# Patient Record
Sex: Male | Born: 2005 | Race: White | Hispanic: No | Marital: Single | Smoking: Never smoker
Health system: Southern US, Community
[De-identification: ages and names within clinical notes are randomized; demographics above are authoritative.]

## PROBLEM LIST (undated history)

## (undated) DIAGNOSIS — F209 Schizophrenia, unspecified: Secondary | ICD-10-CM

## (undated) HISTORY — PX: HERNIA REPAIR: SHX51

---
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
## 2024-01-08 ENCOUNTER — Telehealth: Payer: Self-pay | Admitting: Psychiatry

## 2024-01-08 ENCOUNTER — Other Ambulatory Visit: Payer: Self-pay

## 2024-01-08 ENCOUNTER — Telehealth: Payer: Self-pay

## 2024-01-08 ENCOUNTER — Ambulatory Visit: Payer: MEDICAID | Admitting: Psychiatry

## 2024-01-08 ENCOUNTER — Encounter: Payer: Self-pay | Admitting: Psychiatry

## 2024-01-08 ENCOUNTER — Ambulatory Visit (INDEPENDENT_AMBULATORY_CARE_PROVIDER_SITE_OTHER): Payer: MEDICAID | Admitting: Psychiatry

## 2024-01-08 VITALS — BP 131/78 | HR 74 | Temp 97.9°F | Ht 65.0 in | Wt 151.8 lb

## 2024-01-08 DIAGNOSIS — F200 Paranoid schizophrenia: Secondary | ICD-10-CM

## 2024-01-08 DIAGNOSIS — Z79899 Other long term (current) drug therapy: Secondary | ICD-10-CM | POA: Diagnosis not present

## 2024-01-08 DIAGNOSIS — F1290 Cannabis use, unspecified, uncomplicated: Secondary | ICD-10-CM

## 2024-01-08 DIAGNOSIS — G2571 Drug induced akathisia: Secondary | ICD-10-CM | POA: Diagnosis not present

## 2024-01-08 MED ORDER — CLONIDINE HCL 0.1 MG PO TABS: 0.1000 mg | ORAL_TABLET | Freq: Every day | ORAL | 0 refills | Status: DC

## 2024-01-08 NOTE — Telephone Encounter (Signed)
 pt mother called states that eural is gotten worse since he came into the office. she states that he tryinng to break his phone and just worse than before

## 2024-01-08 NOTE — Patient Instructions (Signed)
 Continue Abilify  maintenance 400 mg every 28 days   Start clonidine 0.1 mg at night  Discontinue pregabalin , trazodone  Next appointment- 10/14 at 11 AM

## 2024-01-08 NOTE — Telephone Encounter (Signed)
 I was able to speak with both him and his mother. Details in the note.

## 2024-01-16 NOTE — Progress Notes (Signed)
 [facility] MD/PA/NP OP Progress Note  01/22/2024 3:16 PM Caleb Reid  MRN:  [id number]  Chief Complaint:  Chief Complaint  Patient presents with   Follow-up   HPI:  This is a follow-up appointment for schizophrenia, akathisia. He states that his temper is better since being on Vraylar.  However, he continues to feel restless.  He would like to have some medication right away to help this.  He cannot continue doing like this. He occasionally raises voices with frustration, stating that he cannot think well, and he needs something.  He just wants to die as he has been having this issue so long, although he denies any suicidal plan or intent. He denies HI, hallucinations. He decline to go to urgent care as he does not want to get admitted.   His mother presents to the visit.  She believes his anger is worse. He snaps, and he could not sit still to complete the paperwork.  She asks if cobenfy can be tried.  She is sure that he tried a clozapine, although she does not recall the outcome or if he had any adverse reaction.  While discussing treatment options, she requests to be seen by a male provider.  It is discussed with them that there is no male provider who has availability to see him. She states that you lied and walked away with [place]ia.  This provider tried to have further discussion. She states that there is a male provider (she saw NP in a hallway on the way out).  This Clinical research associate explained to her that although there is a male provider/NP here, he does not have availability to see him (due to him requiring higher level of care). It is also explained that while there is another male provider in the office, she does not have availability to offer frequent appointments. She states that don't understand and walks out with the patient.   When this writer tried to see another patient after the above encounter, Caleb Reid was standing in front of the door. He asks to talk with this Clinical research associate again.  He apologized  his and his mother's manner earlier.  He states that he would like to try any medication for akathisia.  He would like to try propranolol .  Although he was informed that he self discontinued this medication due to perceived  limited benefit, he states that he wants to try higher dose this time as he does not recall adverse reaction from the medication.  Although he initially states he would like to be off Vraylar, he expressed understanding regarding the importance of staying on antipsychotic for his psychotic symptoms.  He prefers to stay on Vraylar rather than switching to the other antipsychotic.  Although he declined to go to [facility] or inpatient treatment this time, he expressed understanding to do so if this intervention does not work/if any worsening in his symptoms.    Wt Readings from Last 3 Encounters:  01/22/24 148 lb 6.4 oz (67.3 kg) (40%, Z= -0.26)*  01/08/24 151 lb 12.8 oz (68.9 kg) (46%, Z= -0.10)*  12/24/23 153 lb 9.6 oz (69.7 kg) (49%, Z= -0.02)*   * Growth percentiles are based on CDC (Boys, 2-20 Years) data.     Substance use   Tobacco Alcohol Other substances/  Current denies One can a few days ago for restless leg THC- A for sedation (restlessness) +craving, denies caffeine intake  Past denies As above Marijuana use  Past Treatment  Support: mother Household: maternal great grandmother, grandfather (in a Electronics engineer) Marital status: single Number of children: denies Employment: Therapist, art Education: high school Legal: denies He states that his parents are separated.  He grew up at his mother's, fathers and his grandmother's.  He reports just fine relationship with his grandmother.  He contacts with his mother very often.  Although he does not want her to be his guardian, she has been helping him for with his appointments, and denies concern about her except some issues with his family.     Visit Diagnosis:    ICD-10-CM   1. Paranoid schizophrenia (HCC)  F20.0      2. 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 I have reviewed the history. No updates at this time.     Past Medical History:  Past Medical History:  Diagnosis Date   Schizophrenia [facility])     Past Surgical History:  Procedure Laterality Date   HERNIA REPAIR      Family Psychiatric History: Please see initial evaluation for full details. I have reviewed the history. No updates at this time.     Family History:  Family History  Problem Relation Age of Onset   Depression Mother    Schizophrenia Maternal Grandmother     Social History:  Social History   Socioeconomic History   Marital status: Single    Spouse name: Not on file   Number of children: Not on file   Years of education: Not on file   Highest education level: High school graduate  Occupational History   Not on file  Tobacco Use   Smoking status: Never    Passive exposure: Yes   Smokeless tobacco: Never  Vaping Use   Vaping status: Never Used  Substance and Sexual Activity   Alcohol use: No   Drug use: Not on file   Sexual activity: Never  Other Topics Concern   Not on file  Social History Narrative   Not on file   Social Drivers of Health   Financial Resource Strain: Not on file  Food Insecurity: No Food Insecurity (11/03/2023)   Hunger Vital Sign    Worried About Running Out of Food in the Last Year: Never true    Ran Out of Food in the Last Year: Never true  Transportation Needs: No Transportation Needs (11/03/2023)   PRAPARE - Administrator, Civil Service (Medical): No    Lack of Transportation (Non-Medical): No  Physical Activity: Not on file  Stress: Not on file  Social Connections: Not on file    Allergies: No Known Allergies  Metabolic Disorder Labs: Lab Results  Component Value Date   HGBA1C 4.8 11/06/2023   MPG 91.06 11/06/2023   MPG 96.8 10/24/2022   Lab Results  Component Value Date   PROLACTIN 5.4  01/16/2021   Lab Results  Component Value Date   CHOL 124 11/06/2023   TRIG 71 11/06/2023   HDL 43 11/06/2023   CHOLHDL 2.9 11/06/2023   VLDL 14 11/06/2023   LDLCALC 67 11/06/2023   LDLCALC 69 10/24/2022   Lab Results  Component Value Date   TSH 3.866 11/02/2023   TSH 0.747 01/16/2021    Therapeutic Level Labs: No results found for: LITHIUM Lab Results  Component Value Date   VALPROATE 64 11/14/2023   No results found for: CBMZ  Current Medications: Current Outpatient Medications  Medication Sig Dispense Refill  propranolol  (INDERAL ) 10 MG tablet Take 1 tablet (10 mg total) by mouth 2 (two) times daily. 60 tablet 0   ARIPiprazole  ER (ABILIFY  MAINTENA) 400 MG SRER injection Inject 2 mLs (400 mg total) into the muscle every 28 (twenty-eight) days for 3 doses. 3 each 0   cariprazine (VRAYLAR) 1.5 MG capsule Take 1 capsule (1.5 mg total) by mouth daily. 30 capsule 0   nicotine  (NICODERM CQ  - DOSED IN MG/24 HOURS) 14 mg/24hr patch Place 1 patch (14 mg total) onto the skin daily. 28 patch 0   Vitamin D , Ergocalciferol , (DRISDOL ) 1.25 MG (50000 UNIT) CAPS capsule Take 1 capsule (50,000 Units total) by mouth every 7 (seven) days. 5 capsule 0   Current Facility-Administered Medications  Medication Dose Route Frequency Provider Last Rate Last Admin   ARIPiprazole  ER (ABILIFY  MAINTENA) 400 MG prefilled syringe 400 mg  400 mg Intramuscular Q28 days    400 mg at 12/24/23 1416     Musculoskeletal: Strength & Muscle Tone: within normal limits Gait & Station: normal Patient leans: N/A  Psychiatric Specialty Exam: Review of Systems  Psychiatric/Behavioral:  Positive for dysphoric mood, sleep disturbance and suicidal ideas. Negative for agitation, behavioral problems, confusion, decreased concentration, hallucinations and self-injury. The patient is nervous/anxious. The patient is not hyperactive.   All other systems reviewed and are negative.   Blood pressure 128/80, pulse (!)  106, temperature 98.1 F (36.7 C), temperature source Temporal, height 5' 5 (1.651 m), weight 148 lb 6.4 oz (67.3 kg).Body mass index is 24.7 kg/m.  General Appearance: Well Groomed  Eye Contact:  Good  Speech:  Clear and Coherent  Volume:  Normal  Mood:  Anxious, irritable, but redirectable  Affect:  Flat  Thought Process:  Coherent, pauses occasionally due to difficulty in concentration/possible thought blocking  Orientation:  Full (Time, Place, and Person)  Thought Content: Logical   Suicidal Thoughts:  No  Homicidal Thoughts:  No  Memory:  Immediate;   Good  Judgement:  Good  Insight:  Fair  Psychomotor Activity:  Increased  Concentration:  Concentration: Poor and Attention Span: Poor  Recall:  Poor  Fund of Knowledge: Good  Language: Good  Akathisia:  Yes  Handed:  Right  AIMS (if indicated): not done  Assets:  Communication Skills Desire for Improvement  ADL's:  Intact  Cognition: WNL  Sleep:  Poor   Screenings: AUDIT    Flowsheet Row [facility] Ad[facility] (Discharged) from 10/19/2022 in [facility]  Alcohol Use Disorder Identification Test Final Score (AUDIT) 0 0   PHQ2-9    Flowsheet Row Office Visit from 01/08/2024 in [facility]  PHQ-2 Total Score 0   Flowsheet Row Office Visit from 01/08/2024 in [facility] Admission (Discharged) [facility]/20[facility]  C-SSRS RISK CATEGORY Error: Q3, 4, or 5 should not be populated when Q2 is No High Risk Error: Q3, 4, or 5 should not be populated when Q2 is No     Assessment and Plan:  Precious Gilchrest is a 18 y.o. year old male with a history of undifferentiated schizophrenia, depression, cannabis use, who presents for follow up appointment for  below.  1. Paranoid schizophrenia (HCC) # r/o substance induced mood disorder # irritability History: started to experience paranoia at age 21. Admitted to [facility] [place] 7-11/2023 with previous  admissions for schizophrenia He continues to pace through the entire visit, and there is some concern of worsening in akathisia since starting Vraylar. Noted that while he had good benefit in his paranoia since being on Abilify  injection, there is concern of akathisia.  Although it was discussed to try clozapine,  both the patient and his mother are not interested in this medication due to the reported past trial (although there is no record of him trying this medication).  He reports strong preference to stay on the medication while working on treatment for akathisia as outlined below. Will honor this to ensure treatment adherence.  Will continue the current dose of Vraylar to target paranoid schizophrenia.  Noted that Abilify  injection is past due, will plan to hold this due to concern of this significant adverse reaction of akathisia.   2. Akathisia # irritability - propranolol , mirtazapine , gabapentin /pregabalin /, mirtazapine , clonazepam  with limited benefit per patient  He continues to pace through the entire visit.  Etiology of irritability is multifactorial.  He experiences akathisia, and lives with his great-grandmother, who repeats herself many times, and he lacks structure in the house.  While he did try propranolol  with limited benefit in the past, he would like to try this again at the higher dose to see how it is effective for him.  Will try this to target akathisia.  Discussed potential risk of low blood pressure, dizziness.   4. Marijuana use disorder - uds +marijuana 10/2023 He continues to use marijuana due to akathisia.  Psychoeducation is provided again regarding his risk of worsening psychosis.  Will continue motivational interview.    3. High risk medication use       Last checked  EKG  HR 62, QTc45msec 10/2023  Lipid panels LDL67  10/2023  HbA1c 4.8 10/2023    # SW consult He has limited stability in the house, living with his great-grandmother with dementia.  His father is not in the picture currently, and his mother is homeless.  He is open to consider group home. Referral was made for social worker, and they have not heard back from anybody. Will inquire this process.   Noted that although he was recommended to go to [facility] for possible inpatient treatment due to the severity of the symptoms, both the patient and his mother declined this. However, he is receptive to utilize these resources if any worsening in his symptoms.     Plan Continue vraylar 1.5 mg daily  Start propranolol  10 mg twice a day  Next appointment- 10/28 at 8 am, IP   Past trials- Abilify  (akathisia),  risperidone (worsening in symptoms), olanzapine  (worsening), clozapine, depakote  (worsening in irritability), Trazodone  (tingling sensation), benztropine  (more pacing), propranolol  (pacing), Pregabalin , hydroxyzine    The patient demonstrates the following risk factors for suicide: Chronic risk factors for suicide include: psychiatric disorder of schizophrenia and substance use disorder. Acute risk factors for suicide include: unemployment and recent discharge from inpatient psychiatry. Protective factors for this patient include: positive social support and hope for the future. Considering these factors, the overall suicide risk at this point appears to be low. Patient is appropriate for outpatient follow up. He denies gun access at home. Emergency resources which includes 911, ED, suicide crisis line (988) are discussed.    A total of 45 minutes was spent on the following activities during the encounter date, which includes but is not limited to: preparing to see the patient (e.g., reviewing tests and records), obtaining and/or reviewing separately obtained history, performing a  medically necessary examination or  evaluation, counseling and educating the patient, family, or caregiver, ordering medications, tests, or procedures, referring and communicating with other healthcare professionals (when not reported separately), documenting clinical information in the electronic or paper health record, independently interpreting test or lab results and communicating these results to the family or caregiver, and coordinating care (when not reported separately).   Collaboration of Care: Collaboration of Care: Other reviewed notes in Epic  Patient/Guardian was advised Release of Information must be obtained prior to any record release in order to collaborate their care with an outside provider. Patient/Guardian was advised if they have not already done so to contact the registration department to sign all necessary forms in order for us  to release information regarding their care.   Consent: Patient/Guardian gives verbal consent for treatment and assignment of benefits for services provided during this visit. Patient/Guardian expressed understanding and agreed to proceed.    Katheren Sleet, MD 01/22/2024, 3:16 PM

## 2024-01-17 ENCOUNTER — Telehealth: Payer: Self-pay

## 2024-01-17 ENCOUNTER — Telehealth: Payer: Self-pay | Admitting: Psychiatry

## 2024-01-17 NOTE — Telephone Encounter (Signed)
 Patient mom calls stating Caleb Reid is not any better since his last appoitment. He is not sleeping, and still pacing and irritable. Taking medication regularly. May want to discuss if the injection is causing these issues. He is scheduled for next injection on Monday the 13th. Not sure if a sooner appointment can be made for Monday also. Patient and mother made aware you are out of office and you may  advise once back in the office

## 2024-01-17 NOTE — Telephone Encounter (Signed)
 Refer to Dr. Hisada

## 2024-01-17 NOTE — Telephone Encounter (Signed)
 Patient has called and left messages when I return the call he does not answer left him another voicemail explaining that his provider is not in the office and will be back tomorrow informed him that I have left her a message and also left him my direct number to call me back explained the office hours for Fridays

## 2024-01-18 ENCOUNTER — Telehealth (INDEPENDENT_AMBULATORY_CARE_PROVIDER_SITE_OTHER): Payer: MEDICAID | Admitting: Psychiatry

## 2024-01-18 ENCOUNTER — Encounter: Payer: Self-pay | Admitting: Psychiatry

## 2024-01-18 DIAGNOSIS — F200 Paranoid schizophrenia: Secondary | ICD-10-CM | POA: Diagnosis not present

## 2024-01-18 DIAGNOSIS — G2571 Drug induced akathisia: Secondary | ICD-10-CM

## 2024-01-18 DIAGNOSIS — F1290 Cannabis use, unspecified, uncomplicated: Secondary | ICD-10-CM | POA: Diagnosis not present

## 2024-01-18 MED ORDER — CARIPRAZINE HCL 1.5 MG PO CAPS: 1.5000 mg | ORAL_CAPSULE | Freq: Every day | ORAL | 0 refills | Status: DC

## 2024-01-18 NOTE — Progress Notes (Signed)
 Virtual Visit via Video Note  I connected with Caleb Reid on 01/18/24 at  9:40 AM EDT by a video enabled telemedicine application and verified that I am speaking with the correct person using two identifiers.  Location: Patient: home Provider: home office Persons participated in the visit- patient, provider    I discussed the limitations of evaluation and management by telemedicine and the availability of in person appointments. The patient expressed understanding and agreed to proceed.    I discussed the assessment and treatment plan with the patient. The patient was provided an opportunity to ask questions and all were answered. The patient agreed with the plan and demonstrated an understanding of the instructions.   The patient was advised to call back or seek an in-person evaluation if the symptoms worsen or if the condition fails to improve as anticipated.   Katheren Sleet, MD    [facility] MD/PA/NP OP Progress Note  01/18/2024 12:28 PM Caleb Reid  MRN:  [id number]  Chief Complaint:  Chief Complaint  Patient presents with   Follow-up   HPI:  This is a follow-up appointment for schizophrenia, akathisia, marijuana use.  This appointment is made urgently due to the patient concern. He is constantly pacing during the visit.  He states that clonidine did not help.  He discontinued after taking for several days as he felt he is doing worse, although he is unsure.  He continues to have random outbursts of hitting things, although he denies HI.  He tends to use marijuana when he feels irritable as he feels better that way.  He denies AH, VH, paranoia.  Although he thinks Abilify  is helping, he thinks Abilify  is causing him this restlessness.  He feels sick and tired of feeling this way.  He denies SI, stating that he wants to live life again.  He denies decreased need for sleep or euphoria.  He agrees with the plans as outlined below.   Substance use   Tobacco Alcohol Other substances/   Current denies One can a few days ago for restless leg THC- A for sedation (restlessness) +craving, denies caffeine intake  Past denies As above Marijuana use  Past Treatment            Support: mother Household: maternal great grandmother, grandfather (in a camper) Marital status: single Number of children: denies Employment: Therapist, art Education: high school Legal: denies He states that his parents are separated.  He grew up at his mother's, fathers and his grandmother's.  He reports just fine relationship with his grandmother.  He contacts with his mother very often.  Although he does not want her to be his guardian, she has been helping him for with his appointments, and denies concern about her except some issues with his family.   Visit Diagnosis:    ICD-10-CM   1. Paranoid schizophrenia (HCC)  F20.0     2. 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 Marijuana use, continuous  F12.90       Past Psychiatric History: Please see initial evaluation for full details. I have reviewed the history. No updates at this time.     Past Medical History:  Past Medical History:  Diagnosis Date   Schizophrenia [facility])     Past Surgical History:  Procedure Laterality Date   HERNIA REPAIR      Family Psychiatric History: Please see initial evaluation for full details. I have reviewed the history. No updates at this time.     Family History:  Family  History  Problem Relation Age of Onset   Depression Mother    Schizophrenia Maternal Grandmother     Social History:  Social History   Socioeconomic History   Marital status: Single    Spouse name: Not on file   Number of children: Not on file   Years of education: Not on file   Highest education level: High school graduate  Occupational History   Not on file  Tobacco Use   Smoking status: Never    Passive exposure: Yes   Smokeless tobacco: Never  Vaping Use   Vaping status: Never Used  Substance and Sexual Activity   Alcohol use: No    Drug use: Not on file   Sexual activity: Never  Other Topics Concern   Not on file  Social History Narrative   Not on file   Social Drivers of Health   Financial Resource Strain: Not on file  Food Insecurity: No Food Insecurity (11/03/2023)   Hunger Vital Sign    Worried About Running Out of Food in the Last Year: Never true    Ran Out of Food in the Last Year: Never true  Transportation Needs: No Transportation Needs (11/03/2023)   PRAPARE - Administrator, Civil Service (Medical): No    Lack of Transportation (Non-Medical): No  Physical Activity: Not on file  Stress: Not on file  Social Connections: Not on file    Allergies: No Known Allergies  Metabolic Disorder Labs: Lab Results  Component Value Date   HGBA1C 4.8 11/06/2023   MPG 91.06 11/06/2023   MPG 96.8 10/24/2022   Lab Results  Component Value Date   PROLACTIN 5.4 01/16/2021   Lab Results  Component Value Date   CHOL 124 11/06/2023   TRIG 71 11/06/2023   HDL 43 11/06/2023   CHOLHDL 2.9 11/06/2023   VLDL 14 11/06/2023   LDLCALC 67 11/06/2023   LDLCALC 69 10/24/2022   Lab Results  Component Value Date   TSH 3.866 11/02/2023   TSH 0.747 01/16/2021    Therapeutic Level Labs: No results found for: LITHIUM Lab Results  Component Value Date   VALPROATE 64 11/14/2023   No results found for: CBMZ  Current Medications: Current Outpatient Medications  Medication Sig Dispense Refill   cariprazine (VRAYLAR) 1.5 MG capsule Take 1 capsule (1.5 mg total) by mouth daily. 30 capsule 0   ARIPiprazole  ER (ABILIFY  MAINTENA) 400 MG SRER injection Inject 2 mLs (400 mg total) into the muscle every 28 (twenty-eight) days for 3 doses. 3 each 0   nicotine  (NICODERM CQ  - DOSED IN MG/24 HOURS) 14 mg/24hr patch Place 1 patch (14 mg total) onto the skin daily. 28 patch 0   Vitamin D , Ergocalciferol , (DRISDOL ) 1.25 MG (50000 UNIT) CAPS capsule Take 1 capsule (50,000 Units total) by mouth every 7 (seven)  days. 5 capsule 0   Current Facility-Administered Medications  Medication Dose Route Frequency Provider Last Rate Last Admin   ARIPiprazole  ER (ABILIFY  MAINTENA) 400 MG prefilled syringe 400 mg  400 mg Intramuscular Q28 days    400 mg at 12/24/23 1416     Musculoskeletal: Strength & Muscle Tone: N/A Gait & Station: N/A Patient leans: N/A  Psychiatric Specialty Exam: Review of Systems  Psychiatric/Behavioral:  Positive for sleep disturbance. Negative for agitation, behavioral problems, confusion, decreased concentration, dysphoric mood, hallucinations, self-injury and suicidal ideas. The patient is not nervous/anxious and is not hyperactive.   All other systems reviewed and are negative.   There were  no vitals taken for this visit.There is no height or weight on file to calculate BMI.  General Appearance: Well Groomed  Eye Contact:  Good  Speech:  Clear and Coherent  Volume:  Normal  Mood:  not good  Affect:  Flat  Thought Process:  Coherent  Orientation:  Full (Time, Place, and Person)  Thought Content: Logical   Suicidal Thoughts:  No  Homicidal Thoughts:  No  Memory:  Immediate;   Good  Judgement:  Good  Insight:  Fair  Psychomotor Activity:  Increased and Restlessness  Concentration:  Concentration: Fair and Attention Span: Fair  Recall:  [employer] of Knowledge: Good  Language: Good  Akathisia:  Yes  Handed:  Right  AIMS (if indicated): not done  Assets:  Social Support  ADL's:  Intact  Cognition: WNL  Sleep:  Poor   Screenings: AUDIT    Flowsheet Row [facility] Ad[facility] (Discharged) from 10/19/2022 in [facility]  Alcohol Use Disorder Identification Test Final Score (AUDIT) 0 0   PHQ2-9    Flowsheet Row Office Visit from 01/08/2024 in [facility]  PHQ-2 Total Score 0   Flowsheet Row Office Visit from 01/08/2024 in [facility] Admission (Discharged) [facility] ED from 11/02/2023 in [facility]  C-SSRS RISK CATEGORY Error: Q3, 4, or 5 should not be populated when Q2 is No High Risk Error: Q3, 4, or 5 should not be populated when Q2 is No     Assessment and Plan:  Caleb Reid is a 18 y.o. year old male with a history of undifferentiated schizophrenia, depression, cannabis use, who presents for follow up appointment for below.  1. Paranoid schizophrenia (HCC) # r/o substance induced mood disorder # irritability History: started to experience paranoia at age 64. Admitted to [facility] [place] 7-11/2023 with previous admissions for schizophrenia Unstable. He continues to demonstrate flat affect, and paces during the entire visit, although he is respectful otherwise. He had several incident of anger outburst, including hitting a wall. Etiology of irritability is multifactorial.  He experiences akathisia, and lives with his great-grandmother, who repeats herself many times, and he lacks structure in the house, and use of marijuana as a coping mechanism. Given that he has limited benefit from intervention for akathisia, will discontinue Abilify  injection, although it has been effective for hallucination and paranoia.  Will start Vraylar to target schizophrenia.  Will not initiate clozapine at this time due to worsening in his symptoms when he was on olanzapine , and concern of adherence to lab monitoring. Will monitor any possible side effect of akathisia, metabolic side effect, QTc prolongation.  Will monitor for any relapsing his symptoms. He was advised to contact the office if any worsening symptoms or any concerning side effect.   2. Akathisia - propranolol , mirtazapine , gabapentin /pregabalin /, mirtazapine , clonazepam , clonidine with limited benefit per patient He reports limited benefit from clonidine,  and self discontinued this medication.  Noted that according to the chart review, he has been experiencing this symptoms even when he was discharged from the hospital in August. Will switch from Abilify  injection to Vraylar to mitigate the risk.  Will hold clonidine at this time to avoid polypharmacy.   3. Marijuana use, continuous - uds +marijuana 10/2023 He continues to use marijuana, although he is motivated for sobriety.  Psychoeducation is provided again regarding  his risk of worsening psychosis.  Will continue motivational interview.    # SW referral He has limited stability in the house, living with his great-grandmother with dementia.  His father is not in the picture currently, and his mother is homeless. The patient reports interest in group home.  Social worker referral was placed to provide available resources.    3. High risk medication use       Last checked  EKG HR 62, QTc452msec 10/2023  Lipid panels LDL67  10/2023  HbA1c 4.8 10/2023      Plan Start Vraylar 1.5 mg daily, starting tomorrow Hold Abilify  injection (due to akathisia. Received Sept 15th, Due Oct 13th)  Hold clonidine Next appointment- 10/14 at 11 AM, IP  Past trials- risperidone (worsening in symptoms), olanzapine  (worsening), Depakote  (worsening in irritability), Trazodone  (tingling sensation), benztropine  (more pacing), propranolol  (pacing), Pregabalin , hydroxyzine    The patient demonstrates the following risk factors for suicide: Chronic risk factors for suicide include: psychiatric disorder of schizophrenia and substance use disorder. Acute risk factors for suicide include: unemployment and recent discharge from inpatient psychiatry. Protective factors for this patient include: positive social support and hope for the future. Considering these factors, the overall suicide risk at this point appears to be low. Patient is appropriate for outpatient follow up. He denies gun access at home. Emergency resources which  includes 911, ED, suicide crisis line (988) are discussed.    Collaboration of Care: Collaboration of Care: Other reviewed notes in Epic  Patient/Guardian was advised Release of Information must be obtained prior to any record release in order to collaborate their care with an outside provider. Patient/Guardian was advised if they have not already done so to contact the registration department to sign all necessary forms in order for us  to release information regarding their care.   Consent: Patient/Guardian gives verbal consent for treatment and assignment of benefits for services provided during this visit. Patient/Guardian expressed understanding and agreed to proceed.    Katheren Sleet, MD 01/18/2024, 12:28 PM

## 2024-01-18 NOTE — Patient Instructions (Signed)
 Start Vraylar 1.5 mg daily, starting tomorrow Hold Abilify  injection  Hold clonidine Next appointment- 10/14 at 11 AM

## 2024-01-21 ENCOUNTER — Ambulatory Visit: Payer: MEDICAID

## 2024-01-22 ENCOUNTER — Encounter: Payer: Self-pay | Admitting: Psychiatry

## 2024-01-22 ENCOUNTER — Ambulatory Visit (INDEPENDENT_AMBULATORY_CARE_PROVIDER_SITE_OTHER): Payer: MEDICAID | Admitting: Psychiatry

## 2024-01-22 ENCOUNTER — Other Ambulatory Visit: Payer: Self-pay

## 2024-01-22 VITALS — BP 128/80 | HR 106 | Temp 98.1°F | Ht 65.0 in | Wt 148.4 lb

## 2024-01-22 DIAGNOSIS — F200 Paranoid schizophrenia: Secondary | ICD-10-CM | POA: Diagnosis not present

## 2024-01-22 DIAGNOSIS — G2571 Drug induced akathisia: Secondary | ICD-10-CM

## 2024-01-22 MED ORDER — PROPRANOLOL HCL 10 MG PO TABS: 10.0000 mg | ORAL_TABLET | Freq: Two times a day (BID) | ORAL | 0 refills | Status: DC

## 2024-01-22 NOTE — Patient Instructions (Addendum)
 Continue vraylar 1.5 mg daily  Start propranolol  10 mg twice a day  Next appointment- 10/28 at 8 am

## 2024-01-31 ENCOUNTER — Ambulatory Visit: Payer: MEDICAID | Admitting: Psychiatry

## 2024-01-31 ENCOUNTER — Encounter: Payer: Self-pay | Admitting: Psychiatry

## 2024-01-31 ENCOUNTER — Other Ambulatory Visit: Payer: Self-pay

## 2024-01-31 ENCOUNTER — Ambulatory Visit (INDEPENDENT_AMBULATORY_CARE_PROVIDER_SITE_OTHER): Payer: MEDICAID | Admitting: Psychiatry

## 2024-01-31 DIAGNOSIS — F200 Paranoid schizophrenia: Secondary | ICD-10-CM | POA: Diagnosis not present

## 2024-01-31 DIAGNOSIS — G2571 Drug induced akathisia: Secondary | ICD-10-CM

## 2024-01-31 MED ORDER — OLANZAPINE 5 MG PO TABS: 5.0000 mg | ORAL_TABLET | Freq: Every day | ORAL | 0 refills | Status: DC

## 2024-01-31 NOTE — Progress Notes (Addendum)
 BH MD/PA/NP OP Progress Note  01/31/2024 5:21 PM Rhet Rorke  MRN:  [id number]  Chief Complaint:  Chief Complaint  Patient presents with   Follow-up   HPI:  This is a follow-up appointment for schizophrenia, akathisia.  He states that there is no difference. He did not think propranolol  has been helping for akathisia. He feels worse.  He discontinued the Vraylar yesterday due to concern of akathisia.  He was even unable to eat as he could not sit down.  He has been always moving his leg.  He believes his restlessness is a little better since discontinuation.  Although he reports some AH of hearing something, he cannot remember the details.  He ignored it at times, and he is unsure if he actually misperceived some noise, and he apologized (I'm sorry.)  He used THC only last night as he was angry about restlessness.  He thinks his irritability has been better, stating that his grandmother has been doing better since taking the medication.  The treatment option of Cobenfy is discussed.  He would like to try olanzapine  first due to uncertainty about insurance coverage.  He denies SI, HI.  He denies VH.   Wt Readings from Last 3 Encounters:  01/22/24 148 lb 6.4 oz (67.3 kg) (40%, Z= -0.26)*  01/08/24 151 lb 12.8 oz (68.9 kg) (46%, Z= -0.10)*  12/24/23 153 lb 9.6 oz (69.7 kg) (49%, Z= -0.02)*   * Growth percentiles are based on CDC (Boys, 2-20 Years) data.      Substance use   Tobacco Alcohol Other substances/  Current denies One can a few days ago for restless leg THC- A for sedation (restlessness) +craving, denies caffeine intake  Past denies As above Marijuana use  Past Treatment            Support: mother Household: maternal great grandmother, grandfather (in a camper) Marital status: single Number of children: denies Employment: Therapist, art Education: high school Legal: denies He states that his parents are separated.  He grew up at his mother's, fathers and his grandmother's.   He reports just fine relationship with his grandmother.  He contacts with his mother very often.  Although he does not want her to be his guardian, she has been helping him for with his appointments, and denies concern about her except some issues with his family.   Visit Diagnosis:    ICD-10-CM   1. Paranoid schizophrenia (HCC)  F20.0     2. 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 I have reviewed the history. No updates at this time.     Past Medical History:  Past Medical History:  Diagnosis Date   Schizophrenia [facility])     Past Surgical History:  Procedure Laterality Date   HERNIA REPAIR      Family Psychiatric History: Please see initial evaluation for full details. I have reviewed the history. No updates at this time.     Family History:  Family History  Problem Relation Age of Onset   Depression Mother    Schizophrenia Maternal Grandmother     Social History:  Social History   Socioeconomic History   Marital status: Single    Spouse name: Not on file   Number of children: Not on file   Years of education: Not on file   Highest education level: High school graduate  Occupational History   Not on file  Tobacco Use   Smoking  status: Never    Passive exposure: Yes   Smokeless tobacco: Never  Vaping Use   Vaping status: Never Used  Substance and Sexual Activity   Alcohol use: No   Drug use: Not on file   Sexual activity: Never  Other Topics Concern   Not on file  Social History Narrative   Not on file   Social Drivers of Health   Financial Resource Strain: Not on file  Food Insecurity: No Food Insecurity (11/03/2023)   Hunger Vital Sign    Worried About Running Out of Food in the Last Year: Never true    Ran Out of Food in the Last Year: Never true  Transportation Needs: No Transportation Needs (11/03/2023)   PRAPARE - Administrator, Civil Service (Medical): No    Lack of  Transportation (Non-Medical): No  Physical Activity: Not on file  Stress: Not on file  Social Connections: Not on file    Allergies: No Known Allergies  Metabolic Disorder Labs: Lab Results  Component Value Date   HGBA1C 4.8 11/06/2023   MPG 91.06 11/06/2023   MPG 96.8 10/24/2022   Lab Results  Component Value Date   PROLACTIN 5.4 01/16/2021   Lab Results  Component Value Date   CHOL 124 11/06/2023   TRIG 71 11/06/2023   HDL 43 11/06/2023   CHOLHDL 2.9 11/06/2023   VLDL 14 11/06/2023   LDLCALC 67 11/06/2023   LDLCALC 69 10/24/2022   Lab Results  Component Value Date   TSH 3.866 11/02/2023   TSH 0.747 01/16/2021    Therapeutic Level Labs: No results found for: LITHIUM Lab Results  Component Value Date   VALPROATE 64 11/14/2023   No results found for: CBMZ  Current Medications: Current Outpatient Medications  Medication Sig Dispense Refill   OLANZapine  (ZYPREXA ) 5 MG tablet Take 1 tablet (5 mg total) by mouth at bedtime. 30 tablet 0   nicotine  (NICODERM CQ  - DOSED IN MG/24 HOURS) 14 mg/24hr patch Place 1 patch (14 mg total) onto the skin daily. 28 patch 0   propranolol  (INDERAL ) 10 MG tablet Take 1 tablet (10 mg total) by mouth 2 (two) times daily. 60 tablet 0   Vitamin D , Ergocalciferol , (DRISDOL ) 1.25 MG (50000 UNIT) CAPS capsule Take 1 capsule (50,000 Units total) by mouth every 7 (seven) days. 5 capsule 0   No current facility-administered medications for this visit.     Musculoskeletal: Strength & Muscle Tone: within normal limits Gait & Station: normal Patient leans: N/A  Psychiatric Specialty Exam: Review of Systems  Psychiatric/Behavioral:  Positive for decreased concentration, hallucinations and sleep disturbance. Negative for agitation, behavioral problems, confusion, dysphoric mood, self-injury and suicidal ideas. The patient is nervous/anxious. The patient is not hyperactive.   All other systems reviewed and are negative.   There were no  vitals taken for this visit.There is no height or weight on file to calculate BMI.  General Appearance: Well Groomed  Eye Contact:  Good  Speech:  Clear and Coherent  Volume:  Normal  Mood:  Anxious  Affect:  Appropriate, Congruent, and Restricted  Thought Process:  Coherent  Orientation:  Full (Time, Place, and Person)  Thought Content: Logical   Suicidal Thoughts:  No  Homicidal Thoughts:  No  Memory:  Immediate;   Good  Judgement:  Good  Insight:  Fair  Psychomotor Activity:  Increased (pacing during the visit), Normal tone, no rigidity, no resting/postural tremors, no tardive dyskinesia    Concentration:  Concentration: Fair  and Attention Span: Fair  Recall:  [employer] of Knowledge: Good  Language: Good  Akathisia:  Yes  Handed:  Right  AIMS (if indicated): 0   Assets:  Communication Skills Desire for Improvement Social Support  ADL's:  Intact  Cognition: WNL  Sleep:  Fair   Screenings: AUDIT    Flowsheet Row [facility] (Discharged) from 10/19/2022 in [facility]  Alcohol Use Disorder Identification Test Final Score (AUDIT) 0 0   PHQ2-9    Flowsheet Row Office Visit from 01/08/2024 in [facility]  PHQ-2 Total Score 0   Flowsheet Row Office Visit from 01/08/2024 in [facility] Admission (Discharged[facility] ED from 11/02/2023 in [facility]  C-SSRS RISK CATEGORY Error: Q3, 4, or 5 should not be populated when Q2 is No High Risk Error: Q3, 4, or 5 should not be populated when Q2 is No     Assessment and Plan:  Brenda Cowher is a 18 y.o. year old male with a history of undifferentiated schizophrenia, depression, cannabis use, who presents for follow up appointment for below.  1. Paranoid schizophrenia  (HCC) # r/o substance induced mood disorder # irritability History: started to experience paranoia at age 54. Admitted to [facility] [place] 7-11/2023 with previous admissions for schizophrenia Although he reports some AH once since the last visit, he cannot recall the details.  He continues to demonstrate linear thought process, while he has occasional difficulty with concentration.  He reports mild improvement in irritability related to his grandmother being calmer since being on the medication.  Given he continues to experience akathisia, which reportedly has improved since discontinuation of the medication, will switch this medication.  Extensive chart review indicates that he derived good benefit from olanzapine  in the past, although adherence was inconsistent at the time.  Will restart this medication at this time.  Noted that Abilify  injection was discontinued due to concern of significant akathisia.  He was also advised to see if he is eligible for Cobenfy.   2. Akathisia - propranolol , mirtazapine , gabapentin /pregabalin /, mirtazapine , clonazepam  with limited benefit per patient   The exam is notable for slightly calmer affect, and he reports slight improvement in restlessness since discontinuation of Vraylar yesterday.  He had limited benefit from propranolol .  Will discontinue this medication.  Will switch his antipsychotics as outlined above.    4. Marijuana use disorder - uds +marijuana 10/2023 Overall improving. Psychoeducation is provided again regarding his risk of worsening psychosis.  Will continue motivational interview.    3. High risk medication use       Last checked  EKG HR 62, QTc466msec 10/2023  Lipid panels LDL67  10/2023  HbA1c 4.8 10/2023    # SW consult He has limited stability in the house, living with his great-grandmother with dementia.  His father is not in the picture currently, and his mother is homeless.  He is open to consider group home. Referral was made for social  worker.      Plan Hold vraylar, propranolol  Start olanzapine  5 mg at night  Please look into website for [facility]  Next appointment- 11/11 at 11 am, IP  Past trials- Abilify  (akathisia),  risperidone (worsening in symptoms), olanzapine , Vraylar (akathisia), clozapine, depakote  (worsening in irritability), Trazodone  (tingling sensation), benztropine  (more pacing), propranolol  (pacing), Pregabalin , hydroxyzine    The patient  demonstrates the following risk factors for suicide: Chronic risk factors for suicide include: psychiatric disorder of schizophrenia and substance use disorder. Acute risk factors for suicide include: unemployment and recent discharge from inpatient psychiatry. Protective factors for this patient include: positive social support and hope for the future. Considering these factors, the overall suicide risk at this point appears to be low. Patient is appropriate for outpatient follow up. He denies gun access at home. Emergency resources which includes 911, ED, suicide crisis line (988) are discussed.    Collaboration of Care: Collaboration of Care: Other reviewed notes in Epic  Patient/Guardian was advised Release of Information must be obtained prior to any record release in order to collaborate their care with an outside provider. Patient/Guardian was advised if they have not already done so to contact the registration department to sign all necessary forms in order for us  to release information regarding their care.   Consent: Patient/Guardian gives verbal consent for treatment and assignment of benefits for services provided during this visit. Patient/Guardian expressed understanding and agreed to proceed.    Katheren Sleet, MD 01/31/2024, 5:21 PM

## 2024-01-31 NOTE — Patient Instructions (Signed)
 Hold vraylar, propranolol  Start olanzapine  5 mg at night  Please look into website for [facility]  Next appointment- 11/11 at 11 am

## 2024-02-05 ENCOUNTER — Ambulatory Visit: Payer: MEDICAID | Admitting: Psychiatry

## 2024-02-07 ENCOUNTER — Encounter: Payer: Self-pay | Admitting: Psychiatry

## 2024-02-07 ENCOUNTER — Ambulatory Visit (INDEPENDENT_AMBULATORY_CARE_PROVIDER_SITE_OTHER): Payer: MEDICAID | Admitting: Psychiatry

## 2024-02-07 VITALS — BP 112/68 | HR 84 | Temp 99.0°F | Ht 67.0 in | Wt 152.0 lb

## 2024-02-07 DIAGNOSIS — F200 Paranoid schizophrenia: Secondary | ICD-10-CM | POA: Diagnosis not present

## 2024-02-07 DIAGNOSIS — G2571 Drug induced akathisia: Secondary | ICD-10-CM | POA: Diagnosis not present

## 2024-02-07 MED ORDER — AMANTADINE HCL 100 MG PO CAPS: 100.0000 mg | ORAL_CAPSULE | Freq: Two times a day (BID) | ORAL | 0 refills | Status: DC

## 2024-02-07 NOTE — Progress Notes (Signed)
 BH MD/PA/NP OP Progress Note  02/07/2024 12:13 PM Casson Catena  MRN:  [id number]  Chief Complaint:  Chief Complaint  Patient presents with   Follow-up   HPI:  This is a follow-up appointment for schizophrenia and akathisia.  He states that there has been no change.  However, he later states that he has been doing better since being on olanzapine .  He is able to sit down and eat longer period of time, although he started to have some tremors.  He thinks higher dose could be more helpful.  Although he would like to try some medication which he found on the Internet, he is unsure of the name.  He thinks restless leg is going on.  He sleeps 6 hours with middle insomnia.  He paces in the house as he feels restless.  Although he is able to sit still for a few minutes, he does not feel comfortable, and starts pacing again.  He states that the situation at the house is better.  Although his grandmother continues to repeat things, it is not worse.  He denies any significant irritability, anger, or any anger outburst behaviors.  He states that he used to have calm sensation when he drank coffee in the past.  However, he does not think he will feel that way anymore, and he has not drunk lately.  He states that he wants to get that calm feeling again.  Although he states that he does not want to live like this, and would rather die, he adamantly denies any SI, intent or plan.  He had AH twice since the last time.  He does not recall the details as he did not pay much attention.  He denies CAH.  He denies VH, paranoia.  He denies HI.  He agrees with the plans as outlined below.   Wt Readings from Last 3 Encounters:  02/07/24 152 lb (68.9 kg) (46%, Z= -0.11)*  01/22/24 148 lb 6.4 oz (67.3 kg) (40%, Z= -0.26)*  01/08/24 151 lb 12.8 oz (68.9 kg) (46%, Z= -0.10)*   * Growth percentiles are based on CDC (Boys, 2-20 Years) data.    Visit Diagnosis:    ICD-10-CM   1. Paranoid schizophrenia (HCC)  F20.0     2.  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 I have reviewed the history. No updates at this time.     Past Medical History:  Past Medical History:  Diagnosis Date   Schizophrenia [facility])     Past Surgical History:  Procedure Laterality Date   HERNIA REPAIR      Family Psychiatric History: Please see initial evaluation for full details. I have reviewed the history. No updates at this time.     Family History:  Family History  Problem Relation Age of Onset   Depression Mother    Schizophrenia Maternal Grandmother     Social History:  Social History   Socioeconomic History   Marital status: Single    Spouse name: Not on file   Number of children: Not on file   Years of education: Not on file   Highest education level: High school graduate  Occupational History   Not on file  Tobacco Use   Smoking status: Never    Passive exposure: Yes   Smokeless tobacco: Never  Vaping Use   Vaping status: Never Used  Substance and Sexual Activity   Alcohol use: No   Drug  use: Not Currently    Types: Marijuana   Sexual activity: Never  Other Topics Concern   Not on file  Social History Narrative   Not on file   Social Drivers of Health   Financial Resource Strain: Not on file  Food Insecurity: No Food Insecurity (11/03/2023)   Hunger Vital Sign    Worried About Running Out of Food in the Last Year: Never true    Ran Out of Food in the Last Year: Never true  Transportation Needs: No Transportation Needs (11/03/2023)   PRAPARE - Administrator, Civil Service (Medical): No    Lack of Transportation (Non-Medical): No  Physical Activity: Not on file  Stress: Not on file  Social Connections: Not on file    Allergies: No Known Allergies  Metabolic Disorder Labs: Lab Results  Component Value Date   HGBA1C 4.8 11/06/2023   MPG 91.06 11/06/2023   MPG 96.8 10/24/2022   Lab Results  Component Value Date    PROLACTIN 5.4 01/16/2021   Lab Results  Component Value Date   CHOL 124 11/06/2023   TRIG 71 11/06/2023   HDL 43 11/06/2023   CHOLHDL 2.9 11/06/2023   VLDL 14 11/06/2023   LDLCALC 67 11/06/2023   LDLCALC 69 10/24/2022   Lab Results  Component Value Date   TSH 3.866 11/02/2023   TSH 0.747 01/16/2021    Therapeutic Level Labs: No results found for: LITHIUM Lab Results  Component Value Date   VALPROATE 64 11/14/2023   No results found for: CBMZ  Current Medications: Current Outpatient Medications  Medication Sig Dispense Refill   amantadine (SYMMETREL) 100 MG capsule Take 1 capsule (100 mg total) by mouth 2 (two) times daily. 60 capsule 0   OLANZapine  (ZYPREXA ) 5 MG tablet Take 1 tablet (5 mg total) by mouth at bedtime. (Patient taking differently: Take 7.5 mg by mouth at bedtime.) 30 tablet 0   nicotine  (NICODERM CQ  - DOSED IN MG/24 HOURS) 14 mg/24hr patch Place 1 patch (14 mg total) onto the skin daily. (Patient not taking: Reported on 02/07/2024) 28 patch 0   propranolol  (INDERAL ) 10 MG tablet Take 1 tablet (10 mg total) by mouth 2 (two) times daily. (Patient not taking: Reported on 02/07/2024) 60 tablet 0   Vitamin D , Ergocalciferol , (DRISDOL ) 1.25 MG (50000 UNIT) CAPS capsule Take 1 capsule (50,000 Units total) by mouth every 7 (seven) days. (Patient not taking: Reported on 02/07/2024) 5 capsule 0   No current facility-administered medications for this visit.     Musculoskeletal: Strength & Muscle Tone: within normal limits Gait & Station: normal Patient leans: N/A  Psychiatric Specialty Exam: Review of Systems  Psychiatric/Behavioral:  Positive for dysphoric mood and sleep disturbance. Negative for agitation, behavioral problems, confusion, decreased concentration, hallucinations, self-injury and suicidal ideas. The patient is nervous/anxious. The patient is not hyperactive.   All other systems reviewed and are negative.   Blood pressure 112/68, pulse 84,  temperature 99 F (37.2 C), temperature source Temporal, height 5' 7 (1.702 m), weight 152 lb (68.9 kg), SpO2 95%.Body mass index is 23.81 kg/m.  General Appearance: Well Groomed  Eye Contact:  Good  Speech:  Clear and Coherent  Volume:  Normal  Mood:  Depressed  Affect:  Appropriate, Congruent, and less restricted  Thought Process:  Coherent  Orientation:  Full (Time, Place, and Person)  Thought Content: Logical   Suicidal Thoughts:  No  Homicidal Thoughts:  No  Memory:  Immediate;   Fair  Judgement:  Good  Insight:  Fair  Psychomotor Activity:  Increased (pacing), no cogwheel rigidity, no obvious resting tremors.   Concentration:  Concentration: Fair and Attention Span: Fair  Recall:  [employer] of Knowledge: Good  Language: Good  Akathisia:  Yes  Handed:  Right  AIMS (if indicated): 0   Assets:  Communication Skills Desire for Improvement  ADL's:  Intact  Cognition: WNL  Sleep:  Fair   Screenings: AUDIT    Flowsheet Row [facility] Admission (Discharged) from 10/19/2022 in [facility]  Alcohol Use Disorder Identification Test Final Score (AUDIT) 0 0   GAD-7    Flowsheet Row Office Visit from 02/07/2024 in [facility]  Total GAD-7 Score 12   PHQ2-9    Flowsheet Row Office Visit from 02/07/2024 in [facility] Office Visit from 01/08/2024 in [facility]  PHQ-2 Total Score 6 0  PHQ-9 Total Score 14 --   Flowsheet Row Office Visit from 02/07/2024 in [facility] Office Visit from 01/08/2024 in [facility] Ad[facility] (Discharged[facility]  C-SSRS RISK CATEGORY No Risk Error: Q3, 4, or 5 should not be populated when Q2 is No  High Risk     Assessment and Plan:  Syler Norcia is a 18 y.o. year old male with a history of undifferentiated schizophrenia, depression, cannabis use, who presents for follow up appointment for below.  1. Paranoid schizophrenia (HCC) # r/o substance induced mood disorder # irritability History: started to experience paranoia at age 65. Admitted to [facility] [place] 7-11/2023 with previous admissions for schizophrenia There has been overall improvement in irritability.  Although he continues to report AH twice since the last visit, he does not recall the details.  It is notable that although he still has occasional difficulty with concentration, he appears to be improving compared to the previous visit/since being on olanzapine .  He also reports less akathisia.  Will do further uptitration to optimize treatment for paranoid schizophrenia.  Noted that although he will benefit from LAI, there is concern of akathisia from Abilify , and Risperdal reported may cause worsening in his condition.  He was advised again to see if his insurance covers cobenfy.  Will plan to do close monitoring until stabilization of his symptoms.   2. Akathisia - propranolol , mirtazapine , gabapentin /pregabalin /, mirtazapine , clonazepam  with limited benefit per patient    The exam is notable for a little more calmer affect, and he reports slight improvement in akathisia since switching from Vraylar to olanzapine .  Will start amantadine to target akathisia, off label.   4. Marijuana use disorder - uds +marijuana 10/2023 He denies marijuana use in the last few days.Psychoeducation is provided again regarding his risk of worsening psychosis.    3. High risk medication use       Last checked  EKG HR 62, QTc466msec 10/2023  Lipid panels LDL67  10/2023  HbA1c 4.8 10/2023    # SW consult He has limited stability in the house, living with his great-grandmother with dementia.  His father is not in the picture currently, and his  mother is homeless.  He is open to consider group home. Referral was made for social worker.      Plan Increase olanzapine  7.5 mg at night  Start amantadine 100 mg twice a day Please  look into website for [facility]  Next appointment- 11/11 at 11 am, IP   Past trials- Abilify  (akathisia),  risperidone (worsening in symptoms), olanzapine , Vraylar (akathisia), clozapine, Depakote  (worsening in irritability), Trazodone  (tingling sensation), benztropine  (more pacing), propranolol  (pacing), Pregabalin , hydroxyzine    The patient demonstrates the following risk factors for suicide: Chronic risk factors for suicide include: psychiatric disorder of schizophrenia and substance use disorder. Acute risk factors for suicide include: unemployment and recent discharge from inpatient psychiatry. Protective factors for this patient include: positive social support and hope for the future. Considering these factors, the overall suicide risk at this point appears to be low. Patient is appropriate for outpatient follow up. He denies gun access at home. Emergency resources which includes 911, ED, suicide crisis line (988) are discussed.   Collaboration of Care: Collaboration of Care: Other reviewed notes in Epic  Patient/Guardian was advised Release of Information must be obtained prior to any record release in order to collaborate their care with an outside provider. Patient/Guardian was advised if they have not already done so to contact the registration department to sign all necessary forms in order for us  to release information regarding their care.   Consent: Patient/Guardian gives verbal consent for treatment and assignment of benefits for services provided during this visit. Patient/Guardian expressed understanding and agreed to proceed.    Katheren Sleet, MD 02/07/2024, 12:13 PM

## 2024-02-07 NOTE — Patient Instructions (Signed)
 Increase olanzapine  7.5 mg at night  Start amantadine 100 mg twice a day Please look into website for [facility]  Next appointment- 11/11 at 11 am

## 2024-02-11 ENCOUNTER — Telehealth: Payer: Self-pay

## 2024-02-11 NOTE — Telephone Encounter (Signed)
 Medication problem - Call from patient this morning stating he has been feeling more agitated and having some outbursts. Patient stated he has been hitting walls some and still pacing a lot. Patient denied any current suicidal or homicidal ideations, with no plans, means or intent to want to harm self or others.  Informed patient he could go to the [facility] or the [facility] if feeling he is in a crisis and/or to a local emergency department. Patient denied need but stated he wanted to move up his date to have genetic testing done and felt something may need to be changed with his medications as he states reading it can have a stimulant effect and activate patients too.  Agreed to send information to Dr. Vickey and patient agreed to call 911 or [phone] if in any crisis.  Informed patient Dr. Hisada was also supportive if he wanted to change providers to a male that may be in another [facility] practice and patient agreed with plan but would like to have genetic testing done as soon as possible if Dr. Vickey agrees and for her to call him back regarding his current increased agitation and reported periodic outbursts, hitting walls and other items in the home.

## 2024-02-11 NOTE — Telephone Encounter (Signed)
 Contacted patient back to discuss his concerns.  Since his most recent medication changes he feels more agitated, restless, having outburst.  He also believes his hallucinations may have gotten worse.  Discussed with patient about his most recent changes including amantadine.  Patient wonders if this medication could be contributing to his symptoms.  Patient advised to stop amantadine for now.  Advised to stay on the olanzapine  for now.  Patient currently denies any suicidality or homicidality.  However reports that his mother is there to help him if he does need to go to the nearest urgent care or emergency department.  Patient would like to discuss further changes with Dr. Vickey who will be in office tomorrow.  I will route this message to Dr. Hisada as well.

## 2024-02-12 ENCOUNTER — Telehealth: Payer: Self-pay | Admitting: Psychiatry

## 2024-02-12 NOTE — Telephone Encounter (Signed)
 I spoke with the patient in response to his call.  He states that his irritability is getting worse.  He punched a wall this morning in the house.  He went to urgent care at [facility] this afternoon, and was prescribed gabapentin  200 mg (only once) and was discharged.  He asked depakote  to be prescribed.  He states that it was helping for irritability.  When he was informed that he previously mentioned that it worsened the irritability, he states he had worsening in restlessness.  Due to the issues of restlessness causing irritability, he agrees not to restart this medication at this time.  Although he denies any intent/plan of HI, he is unsure if he is able to remain that way.  However, he states that he will try to find a way to calm himself down, and has agreed to go to [facility]/urgent care/ED if any worsening in irritability/HI.  He discontinued the amantadine last night due to worsening hallucinations.  He has not experienced any hallucinations.  He would like to get pharmacodynamic testing, and has agreed to contact the insurance company about his cost.  He expressed understanding that undergoing this testing is not strongly recommended due to the potential for misinterpretation of the results. Noted that he remains calm, and demonstrates linear thought process during the conversation.  He agrees with the following.   - continue olanzapine  7.5 mg at night  - hold amantadine - will call if any sooner availability based on his request

## 2024-02-12 NOTE — Telephone Encounter (Signed)
 Patient is contacted. Details in the note.

## 2024-02-15 ENCOUNTER — Ambulatory Visit (HOSPITAL_COMMUNITY)
Admission: EM | Admit: 2024-02-15 | Discharge: 2024-02-16 | Disposition: A | Discharge status: Home / Self Care | Payer: MEDICAID | Attending: Urology | Admitting: Urology

## 2024-02-15 DIAGNOSIS — R451 Restlessness and agitation: Secondary | ICD-10-CM | POA: Insufficient documentation

## 2024-02-15 DIAGNOSIS — F200 Paranoid schizophrenia: Secondary | ICD-10-CM | POA: Insufficient documentation

## 2024-02-15 DIAGNOSIS — G2571 Drug induced akathisia: Secondary | ICD-10-CM

## 2024-02-15 DIAGNOSIS — T43505A Adverse effect of unspecified antipsychotics and neuroleptics, initial encounter: Secondary | ICD-10-CM | POA: Insufficient documentation

## 2024-02-15 NOTE — Progress Notes (Signed)
   02/15/24 1847  [facility] Triage Screening (Walk-ins at [facility] only)  What Is the Reason for Your Visit/Call Today? Caleb Reid 18y male presents to [facility] accompanied by his grandparents. PT is unsure of any official mental health diagnosis. PT explains he has been feeling restless for the past month, stressed, constant pacing, having agitation and outbursts. PT states he feels he needs a medication adjustment. PT is observed in triage - cannot sit still, pacing in triage. PT denies SI, HI, AVH and alcohol and substance use.  How Long Has This Been Causing You Problems? 1 wk - 1 month  Have You Recently Had Any Thoughts About Hurting Yourself? No  Are You Planning to Commit Suicide/Harm Yourself At This time? No  Have you Recently Had Thoughts About Hurting Someone [name]? No  Are You Planning To Harm Someone At This Time? No  Physical Abuse Denies  Verbal Abuse Denies  Sexual Abuse Denies  Exploitation of patient/patient's resources Denies  Self-Neglect Denies  Are you currently experiencing any auditory, visual or other hallucinations? No  Have You Used Any Alcohol or Drugs in the Past 24 Hours? No  Do you have any current medical co-morbidities that require immediate attention? No  Clinician description of patient physical appearance/behavior: restless, cooperative, polite  What Do You Feel Would Help You the Most Today? Treatment for Depression or other mood problem;Medication(s);Stress Management  Determination of Need Routine (7 days)  Options For Referral Medication Management

## 2024-02-16 DIAGNOSIS — R451 Restlessness and agitation: Secondary | ICD-10-CM | POA: Diagnosis not present

## 2024-02-16 DIAGNOSIS — F200 Paranoid schizophrenia: Secondary | ICD-10-CM | POA: Diagnosis not present

## 2024-02-16 DIAGNOSIS — T43505A Adverse effect of unspecified antipsychotics and neuroleptics, initial encounter: Secondary | ICD-10-CM | POA: Diagnosis not present

## 2024-02-16 LAB — CBC WITH DIFFERENTIAL/PLATELET
Abs Immature Granulocytes: 0.02 K/uL (ref 0.00–0.07)
Basophils Absolute: 0 K/uL (ref 0.0–0.1)
Basophils Relative: 1 %
Eosinophils Absolute: 0.1 K/uL (ref 0.0–0.5)
Eosinophils Relative: 1 %
HCT: 44.8 % (ref 39.0–52.0)
Hemoglobin: 15 g/dL (ref 13.0–17.0)
Immature Granulocytes: 0 %
Lymphocytes Relative: 42 %
Lymphs Abs: 3.5 K/uL (ref 0.7–4.0)
MCH: 30.2 pg (ref 26.0–34.0)
MCHC: 33.5 g/dL (ref 30.0–36.0)
MCV: 90.3 fL (ref 80.0–100.0)
Monocytes Absolute: 0.6 K/uL (ref 0.1–1.0)
Monocytes Relative: 7 %
Neutro Abs: 4.1 K/uL (ref 1.7–7.7)
Neutrophils Relative %: 49 %
Platelets: 226 K/uL (ref 150–400)
RBC: 4.96 MIL/uL (ref 4.22–5.81)
RDW: 12.8 % (ref 11.5–15.5)
WBC: 8.4 K/uL (ref 4.0–10.5)
nRBC: 0 % (ref 0.0–0.2)

## 2024-02-16 LAB — COMPREHENSIVE METABOLIC PANEL WITH GFR
ALT: 11 U/L (ref 0–44)
AST: 18 U/L (ref 15–41)
Albumin: 4.8 g/dL (ref 3.5–5.0)
Alkaline Phosphatase: 52 U/L (ref 38–126)
Anion gap: 13 (ref 5–15)
BUN: 11 mg/dL (ref 6–20)
CO2: 26 mmol/L (ref 22–32)
Calcium: 9.9 mg/dL (ref 8.9–10.3)
Chloride: 102 mmol/L (ref 98–111)
Creatinine, Ser: 0.92 mg/dL (ref 0.61–1.24)
GFR, Estimated: >60 mL/min (ref >60)
Glucose, Bld: 94 mg/dL (ref 70–99)
Potassium: 3.9 mmol/L (ref 3.5–5.1)
Sodium: 141 mmol/L (ref 135–145)
Total Bilirubin: 0.7 mg/dL (ref 0.0–1.2)
Total Protein: 7.7 g/dL (ref 6.5–8.1)

## 2024-02-16 LAB — POCT URINE DRUG SCREEN - MANUAL ENTRY (I-SCREEN)
POC Amphetamine UR: NOT DETECTED
POC Buprenorphine (BUP): NOT DETECTED
POC Cocaine UR: NOT DETECTED
POC Marijuana UR: POSITIVE — AB
POC Methadone UR: NOT DETECTED
POC Methamphetamine UR: NOT DETECTED
POC Morphine: NOT DETECTED
POC Oxazepam (BZO): NOT DETECTED
POC Oxycodone UR: NOT DETECTED
POC Secobarbital (BAR): NOT DETECTED

## 2024-02-16 LAB — LIPID PANEL
Cholesterol: 167 mg/dL (ref 0–200)
HDL: 43 mg/dL (ref >40)
LDL Cholesterol: 105 mg/dL — ABNORMAL HIGH (ref 0–99)
Total CHOL/HDL Ratio: 3.9 ratio
Triglycerides: 97 mg/dL (ref <150)
VLDL: 19 mg/dL (ref 0–40)

## 2024-02-16 LAB — HEMOGLOBIN A1C
Hgb A1c MFr Bld: 4.8 % (ref 4.8–5.6)
Mean Plasma Glucose: 91.06 mg/dL

## 2024-02-16 LAB — VALPROIC ACID LEVEL: Valproic Acid Lvl: <10 ug/mL — ABNORMAL LOW (ref 50–100)

## 2024-02-16 LAB — TSH: TSH: 6.561 u[IU]/mL — ABNORMAL HIGH (ref 0.350–4.500)

## 2024-02-16 MED ORDER — HYDROXYZINE HCL 25 MG PO TABS
25.0000 mg | ORAL_TABLET | Freq: Three times a day (TID) | ORAL | Status: DC | PRN
  Administered 2024-02-16: 25 mg via ORAL
  Filled 2024-02-16: qty 1

## 2024-02-16 MED ORDER — TRAZODONE HCL 50 MG PO TABS
50.0000 mg | ORAL_TABLET | Freq: Every evening | ORAL | Status: DC | PRN
  Administered 2024-02-16: 50 mg via ORAL
  Filled 2024-02-16: qty 1

## 2024-02-16 MED ORDER — MAGNESIUM HYDROXIDE 400 MG/5ML PO SUSP: 30.0000 mL | Freq: Every day | ORAL | Status: DC | PRN

## 2024-02-16 MED ORDER — HALOPERIDOL 5 MG PO TABS
5.0000 mg | ORAL_TABLET | Freq: Three times a day (TID) | ORAL | Status: DC | PRN
  Administered 2024-02-16: 5 mg via ORAL
  Filled 2024-02-16: qty 1

## 2024-02-16 MED ORDER — ACETAMINOPHEN 325 MG PO TABS: 650.0000 mg | ORAL_TABLET | Freq: Four times a day (QID) | ORAL | Status: DC | PRN

## 2024-02-16 MED ORDER — PROPRANOLOL HCL 10 MG PO TABS
10.0000 mg | ORAL_TABLET | Freq: Three times a day (TID) | ORAL | Status: DC | PRN
  Administered 2024-02-16: 10 mg via ORAL
  Filled 2024-02-16: qty 1

## 2024-02-16 MED ORDER — ALUM & MAG HYDROXIDE-SIMETH 200-200-20 MG/5ML PO SUSP: 30.0000 mL | ORAL | Status: DC | PRN

## 2024-02-16 MED ORDER — LORAZEPAM 2 MG/ML IJ SOLN: 2.0000 mg | Freq: Three times a day (TID) | INTRAMUSCULAR | Status: DC | PRN

## 2024-02-16 MED ORDER — DIPHENHYDRAMINE HCL 50 MG/ML IJ SOLN: 50.0000 mg | Freq: Three times a day (TID) | INTRAMUSCULAR | Status: DC | PRN

## 2024-02-16 MED ORDER — DIPHENHYDRAMINE HCL 50 MG/ML IJ SOLN: 50.0000 mg | Freq: Once | INTRAMUSCULAR | Status: DC

## 2024-02-16 MED ORDER — PROPRANOLOL HCL 10 MG PO TABS: 10.0000 mg | ORAL_TABLET | Freq: Three times a day (TID) | ORAL | 0 refills | Status: DC | PRN

## 2024-02-16 MED ORDER — HALOPERIDOL LACTATE 5 MG/ML IJ SOLN: 10.0000 mg | Freq: Three times a day (TID) | INTRAMUSCULAR | Status: DC | PRN

## 2024-02-16 MED ORDER — OLANZAPINE 7.5 MG PO TABS
7.5000 mg | ORAL_TABLET | Freq: Every day | ORAL | Status: DC
Start: 2024-02-16 — End: 2024-02-16
  Administered 2024-02-16: 7.5 mg via ORAL
  Filled 2024-02-16: qty 1

## 2024-02-16 MED ORDER — HALOPERIDOL LACTATE 5 MG/ML IJ SOLN: 5.0000 mg | Freq: Three times a day (TID) | INTRAMUSCULAR | Status: DC | PRN

## 2024-02-16 MED ORDER — DIPHENHYDRAMINE HCL 50 MG PO CAPS
50.0000 mg | ORAL_CAPSULE | Freq: Three times a day (TID) | ORAL | Status: DC | PRN
  Administered 2024-02-16: 50 mg via ORAL
  Filled 2024-02-16: qty 1

## 2024-02-16 NOTE — ED Provider Notes (Signed)
 [facility] Continuous Assessment Admission H&P  Date: 02/16/24 Patient Name: Caleb Reid MRN: [id number] Chief Complaint: increased restlessness, pacing, agitation, and anger outbursts.    Diagnoses:  Final diagnoses:  Antipsychotic-induced akathisia  Schizophrenia, paranoid (HCC)    HPI: Caleb Reid is a 18 year old male with a history of paranoid schizophrenia and akathisia. Patient  presents with complaints of increased restlessness, pacing, agitation, and anger outbursts.   Patient seen face to face and his chart was reviewed. On assessment hi si noted to be pacing back and forth, unable to sit or stand still. He reports progressive worsening of restlessness, pacing, agitation, and anger outbursts over the past 1 month. He says his medication was recently changed from Abilify  monthly injection to Zyprexa  7.5 mg/night PO due to worsening of symptoms. He reports compliance to Zyprexa  7.5 mg as prescribed but continues to experience significant restlessness and agitation. He describes being unable to sit still and states he has been pacing throughout the day. Patient expresses frustration that his current medication is no longer tolerable and requesting to be placed back on Abilify  LAI. per chart review, he did not tolerate Abilify  well in the past and experienced worsening akathisia while on it. His prior medication trials include: Abilify  (akathisia), Risperidone (worsening of symptoms), Olanzapine  (current), Vraylar (akathisia), Clozapine, Depakote  (worsening irritability), Trazodone  (tingling sensations), Benztropine  (increased pacing), Propranolol  (persistent pacing), Pregabalin , and Hydroxyzine . Per chart, his outpatient provider is aware of his ongoing symptoms. The patient states that the restlessness has become "unbearable" and is interfering with his ability to function. He reports ongoing auditory hallucinations but says he is too distracted by the restlessness to clearly hear what the  voices are saying. He denies visual hallucinations, suicidal ideation, or homicidal ideation. He says he sometimes smoke marijuana to relieve restlessness. UDS is positive for marijuana   Patient is alert, cooperative, and oriented x4. He is casually dressed. He is noted to be pacing and  fidgeting continuously in the hallway. He's speech is clear, coherent, and of normal rate and volume. Mood is described as "irritable, frustrated and restless," and affect is congruent with mood. Thought processes are logical and goal-directed. Thought content is notable for auditory hallucinations. No visual hallucinations, delusions of reference, or paranoia are elicited. Insight into his condition is fair, and judgment is intact.   Total Time spent with patient: 30 minutes  Musculoskeletal  Strength & Muscle Tone: within normal limits Gait & Station: normal Patient leans: Right  Psychiatric Specialty Exam  Presentation General Appearance:  Appropriate for Environment  Eye Contact: Fair  Speech: Clear and Coherent  Speech Volume: Normal  Handedness: Right   Mood and Affect  Mood: Depressed; Anxious  Affect: Congruent   Thought Process  Thought Processes: Coherent  Descriptions of Associations:Intact  Orientation:Full (Time, Place and Person)  Thought Content:WDL  Diagnosis of Schizophrenia or Schizoaffective disorder in past: Yes  Duration of Psychotic Symptoms: Greater than six months  Hallucinations:Hallucinations: Auditory  Ideas of Reference:Other (comment)  Suicidal Thoughts:Suicidal Thoughts: No  Homicidal Thoughts:Homicidal Thoughts: No   Sensorium  Memory: Immediate Good; Recent Good; Remote Fair  Judgment: Fair  Insight: Fair   Art Therapist  Concentration: Poor  Attention Span: Poor  Recall: [employer] of Knowledge: Fair  Language: Fair   Psychomotor Activity  Psychomotor Activity: Psychomotor Activity: Normal; Restlessness;  Flacid   Assets  Assets: Communication Skills; Desire for Improvement; Housing; Physical Health; Social Support; Transportation   Sleep  Sleep: Sleep: Fair Number of Hours of  Sleep: 6   Nutritional Assessment (For OBS and FBC admissions only) Has the patient had a weight loss or gain of 10 pounds or more in the last 3 months?: No Has the patient had a decrease in food intake/or appetite?: No Does the patient have dental problems?: No Does the patient have eating habits or behaviors that may be indicators of an eating disorder including binging or inducing vomiting?: No Has the patient recently lost weight without trying?: 0 Has the patient been eating poorly because of a decreased appetite?: 0 Malnutrition Screening Tool Score: 0    Physical Exam Vitals and nursing note reviewed.  Constitutional:      General: He is not in acute distress.    Appearance: He is well-developed.  HENT:     Head: Normocephalic and atraumatic.  Eyes:     Conjunctiva/sclera: Conjunctivae normal.  Cardiovascular:     Rate and Rhythm: Normal rate.  Pulmonary:     Effort: Pulmonary effort is normal.  Abdominal:     Tenderness: There is no abdominal tenderness.  Musculoskeletal:        General: Normal range of motion.     Cervical back: Normal range of motion.  Skin:    Capillary Refill: Capillary refill takes less than 2 seconds.  Neurological:     Mental Status: He is alert and oriented to person, place, and time.  Psychiatric:        Attention and Perception: Attention and perception normal.        Mood and Affect: Mood is anxious.        Speech: Speech normal.        Behavior: Behavior normal. Behavior is cooperative.        Thought Content: Thought content normal.    Review of Systems  Constitutional: Negative.   HENT: Negative.    Eyes: Negative.   Respiratory: Negative.    Cardiovascular: Negative.   Gastrointestinal: Negative.   Genitourinary: Negative.   Musculoskeletal:  Negative.   Skin: Negative.   Neurological: Negative.   Endo/Heme/Allergies: Negative.   Psychiatric/Behavioral:  Positive for substance abuse. The patient is nervous/anxious.     Blood pressure (!) 140/98, pulse 77, temperature 99 F (37.2 C), temperature source Oral, resp. rate 17, SpO2 96%. There is no height or weight on file to calculate BMI.  Past Psychiatric History: paranoid schizophrenia and akathisia   Is the patient at risk to self? No  Has the patient been a risk to self in the past 6 months? No .    Has the patient been a risk to self within the distant past? No   Is the patient a risk to others? No   Has the patient been a risk to others in the past 6 months? No   Has the patient been a risk to others within the distant past? No   Past Medical History:  Past Medical History:  Diagnosis Date   Schizophrenia (HCC)      Family History:  Family History  Problem Relation Age of Onset   Depression Mother    Schizophrenia Maternal Grandmother      Social History:  Social History   Tobacco Use   Smoking status: Never    Passive exposure: Yes   Smokeless tobacco: Never  Vaping Use   Vaping status: Never Used  Substance Use Topics   Alcohol use: No   Drug use: Not Currently    Types: Marijuana     Last Labs:  Admission  on 02/15/2024  Component Date Value Ref Range Status   WBC 02/16/2024 8.4  4.0 - 10.5 K/uL Final   RBC 02/16/2024 4.96  4.22 - 5.81 MIL/uL Final   Hemoglobin 02/16/2024 15.0  13.0 - 17.0 g/dL Final   HCT 88/91/7974 44.8  39.0 - 52.0 % Final   MCV 02/16/2024 90.3  80.0 - 100.0 fL Final   MCH 02/16/2024 30.2  26.0 - 34.0 pg Final   MCHC 02/16/2024 33.5  30.0 - 36.0 g/dL Final   RDW 88/91/7974 12.8  11.5 - 15.5 % Final   Platelets 02/16/2024 226  150 - 400 K/uL Final   nRBC 02/16/2024 0.0  0.0 - 0.2 % Final   Neutrophils Relative % 02/16/2024 49  % Final   Neutro Abs 02/16/2024 4.1  1.7 - 7.7 K/uL Final   Lymphocytes Relative 02/16/2024  42  % Final   Lymphs Abs 02/16/2024 3.5  0.7 - 4.0 K/uL Final   Monocytes Relative 02/16/2024 7  % Final   Monocytes Absolute 02/16/2024 0.6  0.1 - 1.0 K/uL Final   Eosinophils Relative 02/16/2024 1  % Final   Eosinophils Absolute 02/16/2024 0.1  0.0 - 0.5 K/uL Final   Basophils Relative 02/16/2024 1  % Final   Basophils Absolute 02/16/2024 0.0  0.0 - 0.1 K/uL Final   Immature Granulocytes 02/16/2024 0  % Final   Abs Immature Granulocytes 02/16/2024 0.02  0.00 - 0.07 K/uL Final   Performed at [facility], [address], [place], [place] [address]   Sodium 02/16/2024 141  135 - 145 mmol/L Final   Potassium 02/16/2024 3.9  3.5 - 5.1 mmol/L Final   Chloride 02/16/2024 102  98 - 111 mmol/L Final   CO2 02/16/2024 26  22 - 32 mmol/L Final   Glucose, Bld 02/16/2024 94  70 - 99 mg/dL Final   Glucose reference range applies only to samples taken after fasting for at least 8 hours.   BUN 02/16/2024 11  6 - 20 mg/dL Final   Creatinine, Ser 02/16/2024 0.92  0.61 - 1.24 mg/dL Final   Calcium 88/91/7974 9.9  8.9 - 10.3 mg/dL Final   Total Protein 88/91/7974 7.7  6.5 - 8.1 g/dL Final   Albumin 88/91/7974 4.8  3.5 - 5.0 g/dL Final   AST 88/91/7974 18  15 - 41 U/L Final   ALT 02/16/2024 11  0 - 44 U/L Final   Alkaline Phosphatase 02/16/2024 52  38 - 126 U/L Final   Total Bilirubin 02/16/2024 0.7  0.0 - 1.2 mg/dL Final   GFR, Estimated 02/16/2024 >60  >60 mL/min Final   Comment: (NOTE) Calculated using the CKD-EPI Creatinine Equation (2021)    Anion gap 02/16/2024 13  5 - 15 Final   Performed at [facility], [address] [place], [place] [address]   Hgb A1c MFr Bld 02/16/2024 4.8  4.8 - 5.6 % Final   Comment: (NOTE) Diagnosis of Diabetes The following HbA1c ranges recommended by the American Diabetes Association (ADA) may be used as an aid in the diagnosis of diabetes mellitus.  Hemoglobin             Suggested A1C NGSP%              Diagnosis  <5.7                   Non  Diabetic  5.7-6.4  Pre-Diabetic  >6.4                   Diabetic  <7.0                   Glycemic control for                       adults with diabetes.     Mean Plasma Glucose 02/16/2024 91.06  mg/dL Final   Performed at [facility], [address] [place], [place] [address]   Cholesterol 02/16/2024 167  0 - 200 mg/dL Final   Triglycerides 88/91/7974 97  <150 mg/dL Final   HDL 88/91/7974 43  >40 mg/dL Final   Total CHOL/HDL Ratio 02/16/2024 3.9  RATIO Final   VLDL 02/16/2024 19  0 - 40 mg/dL Final   LDL Cholesterol 02/16/2024 105 (H)  0 - 99 mg/dL Final   Comment:        Total Cholesterol/HDL:CHD Risk Coronary Heart Disease Risk Table                     Men   Women  1/2 Average Risk   3.4   3.3  Average Risk       5.0   4.4  2 X Average Risk   9.6   7.1  3 X Average Risk  23.4   11.0        Use the calculated Patient Ratio above and the CHD Risk Table to determine the patient's CHD Risk.        ATP III CLASSIFICATION (LDL):  <100     mg/dL   Optimal  899-870  mg/dL   Near or Above                    Optimal  130-159  mg/dL   Borderline  839-810  mg/dL   High  >809     mg/dL   Very High Performed at [facility], [address] [place], [place] [address]    POC Amphetamine UR 02/16/2024 None Detected  NONE DETECTED (Cut Off Level 1000 ng/mL) Final   POC Secobarbital (BAR) 02/16/2024 None Detected  NONE DETECTED (Cut Off Level 300 ng/mL) Final   POC Buprenorphine (BUP) 02/16/2024 None Detected  NONE DETECTED (Cut Off Level 10 ng/mL) Final   POC Oxazepam (BZO) 02/16/2024 None Detected  NONE DETECTED (Cut Off Level 300 ng/mL) Final   POC Cocaine UR 02/16/2024 None Detected  NONE DETECTED (Cut Off Level 300 ng/mL) Final   POC Methamphetamine UR 02/16/2024 None Detected  NONE DETECTED (Cut Off Level 1000 ng/mL) Final   POC Morphine 02/16/2024 None Detected  NONE DETECTED (Cut Off Level 300 ng/mL) Final   POC Methadone UR 02/16/2024 None  Detected  NONE DETECTED (Cut Off Level 300 ng/mL) Final   POC Oxycodone UR 02/16/2024 None Detected  NONE DETECTED (Cut Off Level 100 ng/mL) Final   POC Marijuana UR 02/16/2024 Positive (A)  NONE DETECTED (Cut Off Level 50 ng/mL) Final   Valproic Acid  Lvl 02/16/2024 <10 (L)  50 - 100 ug/mL Final   Comment: RESULT CONFIRMED BY MANUAL DILUTION Performed at [facility], [address] [place], [place] [address]    TSH 02/16/2024 6.561 (H)  0.350 - 4.500 uIU/mL Final   Comment: Performed by a 3rd Generation assay with a functional sensitivity of <=0.01 uIU/mL. Performed at [facility], [address] [place], [place] [address]   Admission on 11/03/2023, Discharged on 11/16/2023  Component Date Value Ref Range Status   Magnesium  11/05/2023 2.2  1.7 - 2.4 mg/dL Final   Performed at [facility], [address], [place], [place] [address]   RPR Ser Ql 11/05/2023 NON REACTIVE  NON REACTIVE Final   Performed at [facility], [address] [place], [place] [address]   Vit D, 25-Hydroxy 11/05/2023 29.69 (L)  30 - 100 ng/mL Final   Comment: (NOTE) Vitamin D  deficiency has been defined by the Institute of Medicine  and an Endocrine Society practice guideline as a level of serum 25-OH  vitamin D  less than 20 ng/mL (1,2). The Endocrine Society went on to  further define vitamin D  insufficiency as a level between 21 and 29  ng/mL (2).  1. IOM (Institute of Medicine). 2010. Dietary reference intakes for  calcium and D. [place]: [employer]. 2. Holick MF, Binkley Cathcart, Bischoff-Ferrari HA, et al. Evaluation,  treatment, and prevention of vitamin D  deficiency: an Endocrine  Society clinical practice guideline, JCEM. 2011 Jul; 96(7): 1911-30.  Performed at [facility], [address] [place], [place] [address]    Vitamin B-12 11/05/2023 277  180 - 914 pg/mL Final   Comment: (NOTE) This assay is not validated for testing neonatal  or myeloproliferative syndrome specimens for Vitamin B12 levels. Performed at [facility], [address] [place], [place] [address]    Hgb A1c MFr Bld 11/06/2023 4.8  4.8 - 5.6 % Final   Comment: (NOTE) Diagnosis of Diabetes The following HbA1c ranges recommended by the American Diabetes Association (ADA) may be used as an aid in the diagnosis of diabetes mellitus.  Hemoglobin             Suggested A1C NGSP%              Diagnosis  <5.7                   Non Diabetic  5.7-6.4                Pre-Diabetic  >6.4                   Diabetic  <7.0                   Glycemic control for                       adults with diabetes.     Mean Plasma Glucose 11/06/2023 91.06  mg/dL Final   Performed at [facility], [address], [place], [place] [address]   Cholesterol 11/06/2023 124  0 - 200 mg/dL Final   Triglycerides 92/70/7974 71  <150 mg/dL Final   HDL 92/70/7974 43  >40 mg/dL Final   Total CHOL/HDL Ratio 11/06/2023 2.9  RATIO Final   VLDL 11/06/2023 14  0 - 40 mg/dL Final   LDL Cholesterol 11/06/2023 67  0 - 99 mg/dL Final   Comment:        Total Cholesterol/HDL:CHD Risk Coronary Heart Disease Risk Table                     Men   Women  1/2 Average Risk   3.4   3.3  Average Risk       5.0   4.4  2 X  Average Risk   9.6   7.1  3 X Average Risk  23.4   11.0        Use the calculated Patient Ratio above and the CHD Risk Table to determine the patient's CHD Risk.        ATP III CLASSIFICATION (LDL):  <100     mg/dL   Optimal  899-870  mg/dL   Near or Above                    Optimal  130-159  mg/dL   Borderline  839-810  mg/dL   High  >809     mg/dL   Very High Performed at [facility], [address] [place], [place] [address]    Valproic Acid  Lvl 11/14/2023 64  50 - 100 ug/mL Final   Performed at [facility], [address], [place], [place] [address]  Admission on 11/02/2023, Discharged on  11/03/2023  Component Date Value Ref Range Status   WBC 11/02/2023 8.2  4.0 - 10.5 K/uL Final   RBC 11/02/2023 4.41  4.22 - 5.81 MIL/uL Final   Hemoglobin 11/02/2023 13.1  13.0 - 17.0 g/dL Final   HCT 92/74/7974 40.4  39.0 - 52.0 % Final   MCV 11/02/2023 91.6  80.0 - 100.0 fL Final   MCH 11/02/2023 29.7  26.0 - 34.0 pg Final   MCHC 11/02/2023 32.4  30.0 - 36.0 g/dL Final   RDW 92/74/7974 13.1  11.5 - 15.5 % Final   Platelets 11/02/2023 201  150 - 400 K/uL Final   nRBC 11/02/2023 0.0  0.0 - 0.2 % Final   Neutrophils Relative % 11/02/2023 48  % Final   Neutro Abs 11/02/2023 4.0  1.7 - 7.7 K/uL Final   Lymphocytes Relative 11/02/2023 38  % Final   Lymphs Abs 11/02/2023 3.1  0.7 - 4.0 K/uL Final   Monocytes Relative 11/02/2023 9  % Final   Monocytes Absolute 11/02/2023 0.7  0.1 - 1.0 K/uL Final   Eosinophils Relative 11/02/2023 4  % Final   Eosinophils Absolute 11/02/2023 0.3  0.0 - 0.5 K/uL Final   Basophils Relative 11/02/2023 1  % Final   Basophils Absolute 11/02/2023 0.1  0.0 - 0.1 K/uL Final   Immature Granulocytes 11/02/2023 0  % Final   Abs Immature Granulocytes 11/02/2023 0.02  0.00 - 0.07 K/uL Final   Performed at [facility], [address] [place], [place] [address]   Sodium 11/02/2023 140  135 - 145 mmol/L Final   Potassium 11/02/2023 4.5  3.5 - 5.1 mmol/L Final   Chloride 11/02/2023 108  98 - 111 mmol/L Final   CO2 11/02/2023 23  22 - 32 mmol/L Final   Glucose, Bld 11/02/2023 89  70 - 99 mg/dL Final   Glucose reference range applies only to samples taken after fasting for at least 8 hours.   BUN 11/02/2023 9  6 - 20 mg/dL Final   Creatinine, Ser 11/02/2023 0.76  0.61 - 1.24 mg/dL Final   Calcium 92/74/7974 9.5  8.9 - 10.3 mg/dL Final   Total Protein 92/74/7974 7.0  6.5 - 8.1 g/dL Final   Albumin 92/74/7974 4.3  3.5 - 5.0 g/dL Final   AST 92/74/7974 14 (L)  15 - 41 U/L Final   ALT 11/02/2023 11  0 - 44 U/L Final   Alkaline Phosphatase 11/02/2023 64  38 - 126 U/L  Final   Total Bilirubin 11/02/2023 0.6  0.0 - 1.2 mg/dL Final   GFR, Estimated 11/02/2023 >60  >60 mL/min Final   Comment: (NOTE) Calculated using the CKD-EPI Creatinine Equation (2021)    Anion gap 11/02/2023 9  5 - 15 Final   Performed at [facility], [address], [place], [place] [address]   Alcohol, Ethyl (B) 11/02/2023 <15  <15 mg/dL Final   Comment: (NOTE) For medical purposes only. Performed at [facility], [address], [place], [place] [address]    TSH 11/02/2023 3.866  0.350 - 4.500 uIU/mL Final   Comment: Performed by a 3rd Generation assay with a functional sensitivity of <=0.01 uIU/mL. Performed at [facility], [address] [place], [place] [address]    POC Amphetamine UR 11/02/2023 None Detected  NONE DETECTED (Cut Off Level 1000 ng/mL) Final   POC Secobarbital (BAR) 11/02/2023 None Detected  NONE DETECTED (Cut Off Level 300 ng/mL) Final   POC Buprenorphine (BUP) 11/02/2023 None Detected  NONE DETECTED (Cut Off Level 10 ng/mL) Final   POC Oxazepam (BZO) 11/02/2023 None Detected  NONE DETECTED (Cut Off Level 300 ng/mL) Final   POC Cocaine UR 11/02/2023 None Detected  NONE DETECTED (Cut Off Level 300 ng/mL) Final   POC Methamphetamine UR 11/02/2023 None Detected  NONE DETECTED (Cut Off Level 1000 ng/mL) Final   POC Morphine 11/02/2023 None Detected  NONE DETECTED (Cut Off Level 300 ng/mL) Final   POC Methadone UR 11/02/2023 None Detected  NONE DETECTED (Cut Off Level 300 ng/mL) Final   POC Oxycodone UR 11/02/2023 None Detected  NONE DETECTED (Cut Off Level 100 ng/mL) Final   POC Marijuana UR 11/02/2023 None Detected  NONE DETECTED (Cut Off Level 50 ng/mL) Final  Admission on 10/15/2023, Discharged on 10/16/2023  Component Date Value Ref Range Status   Sodium 10/15/2023 141  135 - 145 mmol/L Final   Potassium 10/15/2023 3.2 (L)  3.5 - 5.1 mmol/L Final   Chloride 10/15/2023 107  98 - 111 mmol/L Final   CO2 10/15/2023 25  22 - 32 mmol/L Final    Glucose, Bld 10/15/2023 115 (H)  70 - 99 mg/dL Final   Glucose reference range applies only to samples taken after fasting for at least 8 hours.   BUN 10/15/2023 8  6 - 20 mg/dL Final   Creatinine, Ser 10/15/2023 0.72  0.61 - 1.24 mg/dL Final   Calcium 92/92/7974 9.8  8.9 - 10.3 mg/dL Final   Total Protein 92/92/7974 8.3 (H)  6.5 - 8.1 g/dL Final   Albumin 92/92/7974 5.2 (H)  3.5 - 5.0 g/dL Final   AST 92/92/7974 19  15 - 41 U/L Final   ALT 10/15/2023 10  0 - 44 U/L Final   Alkaline Phosphatase 10/15/2023 52  38 - 126 U/L Final   Total Bilirubin 10/15/2023 0.7  0.0 - 1.2 mg/dL Final   GFR, Estimated 10/15/2023 >60  >60 mL/min Final   Comment: (NOTE) Calculated using the CKD-EPI Creatinine Equation (2021)    Anion gap 10/15/2023 9  5 - 15 Final   Performed at [facility], [address] [place], [place] [address]   Alcohol, Ethyl (B) 10/15/2023 <15  <15 mg/dL Final   Comment: (NOTE) For medical purposes only. Performed at [facility], [address] [place], [place] [address]    WBC 10/15/2023 5.5  4.0 - 10.5 K/uL Final   RBC 10/15/2023 4.70  4.22 - 5.81 MIL/uL Final   Hemoglobin 10/15/2023 14.2  13.0 -  17.0 g/dL Final   HCT 92/92/7974 42.1  39.0 - 52.0 % Final   MCV 10/15/2023 89.6  80.0 - 100.0 fL Final   MCH 10/15/2023 30.2  26.0 - 34.0 pg Final   MCHC 10/15/2023 33.7  30.0 - 36.0 g/dL Final   RDW 92/92/7974 12.9  11.5 - 15.5 % Final   Platelets 10/15/2023 192  150 - 400 K/uL Final   nRBC 10/15/2023 0.0  0.0 - 0.2 % Final   Performed at [facility], [address] [place], [place] [address]   Tricyclic, Ur Screen 10/15/2023 NONE DETECTED  NONE DETECTED Final   Amphetamines, Ur Screen 10/15/2023 NONE DETECTED  NONE DETECTED Final   MDMA (Ecstasy)Ur Screen 10/15/2023 NONE DETECTED  NONE DETECTED Final   Cocaine Metabolite,Ur [place] 10/15/2023 NONE DETECTED  NONE DETECTED Final   Opiate, Ur Screen 10/15/2023 NONE DETECTED  NONE DETECTED Final    Phencyclidine (PCP) Ur S 10/15/2023 NONE DETECTED  NONE DETECTED Final   Cannabinoid 50 Ng, Ur [place] 10/15/2023 POSITIVE (A)  NONE DETECTED Final   Barbiturates, Ur Screen 10/15/2023 NONE DETECTED  NONE DETECTED Final   Benzodiazepine, Ur Scrn 10/15/2023 NONE DETECTED  NONE DETECTED Final   Methadone Scn, Ur 10/15/2023 NONE DETECTED  NONE DETECTED Final   Comment: (NOTE) Tricyclics + metabolites, urine    Cutoff 1000 ng/mL Amphetamines + metabolites, urine  Cutoff 1000 ng/mL MDMA (Ecstasy), urine              Cutoff 500 ng/mL Cocaine Metabolite, urine          Cutoff 300 ng/mL Opiate + metabolites, urine        Cutoff 300 ng/mL Phencyclidine (PCP), urine         Cutoff 25 ng/mL Cannabinoid, urine                 Cutoff 50 ng/mL Barbiturates + metabolites, urine  Cutoff 200 ng/mL Benzodiazepine, urine              Cutoff 200 ng/mL Methadone, urine                   Cutoff 300 ng/mL  The urine drug screen provides only a preliminary, unconfirmed analytical test result and should not be used for non-medical purposes. Clinical consideration and professional judgment should be applied to any positive drug screen result due to possible interfering substances. A more specific alternate chemical method must be used in order to obtain a confirmed analytical result. Gas chromatography / mass spectrometry (GC/MS) is the preferred confirm                          atory method. Performed at [facility], [address], [place], [place] [address]     Allergies: Patient has no known allergies.  Medications:  Facility Ordered Medications  Medication   acetaminophen  (TYLENOL ) tablet 650 mg   alum & mag hydroxide-simeth (MAALOX/MYLANTA) 200-200-20 MG/5ML suspension 30 mL   magnesium  hydroxide (MILK OF MAGNESIA) suspension 30 mL   hydrOXYzine  (ATARAX ) tablet 25 mg   traZODone  (DESYREL ) tablet 50 mg   haloperidol  (HALDOL ) tablet 5 mg   And   diphenhydrAMINE  (BENADRYL ) capsule 50 mg    haloperidol  lactate (HALDOL ) injection 5 mg   And   diphenhydrAMINE  (BENADRYL ) injection 50 mg   And   LORazepam  (ATIVAN ) injection 2 mg   haloperidol  lactate (HALDOL ) injection 10 mg   And   diphenhydrAMINE  (BENADRYL )  injection 50 mg   And   LORazepam  (ATIVAN ) injection 2 mg   diphenhydrAMINE  (BENADRYL ) injection 50 mg   OLANZapine  (ZYPREXA ) tablet 7.5 mg   PTA Medications  Medication Sig   nicotine  (NICODERM CQ  - DOSED IN MG/24 HOURS) 14 mg/24hr patch Place 1 patch (14 mg total) onto the skin daily. (Patient not taking: Reported on 02/07/2024)   Vitamin D , Ergocalciferol , (DRISDOL ) 1.25 MG (50000 UNIT) CAPS capsule Take 1 capsule (50,000 Units total) by mouth every 7 (seven) days. (Patient not taking: Reported on 02/07/2024)   propranolol  (INDERAL ) 10 MG tablet Take 1 tablet (10 mg total) by mouth 2 (two) times daily. (Patient not taking: Reported on 02/07/2024)   OLANZapine  (ZYPREXA ) 5 MG tablet Take 1 tablet (5 mg total) by mouth at bedtime. (Patient taking differently: Take 7.5 mg by mouth at bedtime.)      Medical Decision Making  Administer Benadryl  50 mg IM-- off-label treatment for akathisia. Continue home dose of Zyprexa  7.5 mg nightly. Patient will be admitted to [facility] for continuous monitoring and assessment of symptoms, safety, and medication response. He will be reassessment on 02/16/2024 by the psychiatric provider to evaluate improvement and for possible discharge.    Recommendations  Based on my evaluation the patient does not appear to have an emergency medical condition.  Kathryne DELENA Show, NP 02/16/24  4:45 AM
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
## 2024-02-16 NOTE — Progress Notes (Signed)
 Pt is awake, alert and oriented. Pt did not voice any complaints of pain or discomfort. No signs of acute distress noted. Pt endorses anxiety and was pacing on the unit. PRN Hydroxyzine  administered @0835  per order. Pt reported that it did not work. Pt stated that he feels like banging his head. PRN Haldol  and Benadryl  administered @1033 . MD was notified of pt's statement. Pt was able to lay down. Staff will monitor for pt's safety.

## 2024-02-16 NOTE — Discharge Instructions (Signed)
 Please go to your next outpatient psychiatrist appointment with Dr. Vickey on 11/11.  Follow-up recommendations:  Activity:  Normal, as tolerated Diet:  Per PCP recommendation  Patient is instructed prior to discharge to: Take all medications as prescribed by his mental healthcare provider. Report any adverse effects and/or reactions from the medicines to his outpatient provider promptly. Patient has been instructed & cautioned: To not engage in alcohol and or illegal drug use while on prescription medicines.  In the event of worsening symptoms, patient is instructed to call the crisis hotline at 988, 911 and or go to the nearest ED for appropriate evaluation and treatment of symptoms. To follow-up with his primary care provider for your other medical issues, concerns and or health care needs.

## 2024-02-16 NOTE — ED Notes (Addendum)
 RN spoke with patient A&Ox4. Denies intent to harm self/others when asked. Denies A/VH or any physical complaints when asked. No acute distress noted.Patient initially noted as pacing in hallway prior to coming to floor.  Active listening, support and encouragement provided. Routine safety checks conducted according to facility protocol.Nutrition and toileting offered. Encouraged patient to notify staff if thoughts of harm toward self or others arise. Patient verbalize understanding and agreement.

## 2024-02-16 NOTE — ED Notes (Addendum)
Patient sleeping; no s/s of distress.

## 2024-02-16 NOTE — BH Assessment (Signed)
 Comprehensive Clinical Assessment (CCA) Note  02/16/2024 Jamine Highfill [id number]  Chief Complaint:  Chief Complaint  Patient presents with   Anxiety   Stress  Disposition: Per Ene Ajibola,NP patient is recommended for overnight observation with re-evaluation in the morning.   The patient demonstrates the following risk factors for suicide: Chronic risk factors for suicide include: psychiatric disorder of Paranoid Schizophrenia, cannabis use disorder. Acute risk factors for suicide include: N/A. Protective factors for this patient include: hope for the future. Considering these factors, the overall suicide risk at this point appears to be low. Patient is not appropriate for outpatient follow up.   Patient is a 18 year old male with a history of Paranoid Schizophrenia, cannabis use disorder who presents voluntarily to [facility] for an assessment. Patient resides in the home with his grandmother and identifies his mother as his primary support system. Patient reports that he has been experiencing increased agitation, restlessness and anger outbursts for the past 2 months. He is unable to identify a specific trigger for his symptoms but states he is struggling with the restlessness. He reports he was seen about 2 months ago and was being treated for his agitation but he is unable to elaborate further. He denies any specific stressors at this time.Patient reports irritability, loss of interest to do things they enjoy, fatigue, lack of concentration, decreased sleep, and decreased appetite. Patient denies past suicide attempts. Patient denies NSSIB,substance abuse, paranoia, SI, HI, AVH.  Patient denies history of abuse or trauma. Patient denies current legal problems. Patient is receiving outpatient psychiatry services, with Dr.Hisada. He is not established with outpatient therapy per his report. Patient reports he  takes his medications as prescribed (see MAR) and denies recent  medication changes.  Patient denies access to weapons.  Treatment options were discussed and patient is in agreement with recommendation for observation.   During evaluation patient is pacing in the room and unable to sit still. He is alert, oriented x 4, calm, cooperative and attentive. His mood is anxious and restless with congruent affect. He has normal speech.  Patient answered question appropriately.      Visit Diagnosis:  Paranoid Schizophrenia    CCA Screening, Triage and Referral (STR)  Patient Reported Information How did you hear about us ? Family/Friend  What Is the Reason for Your Visit/Call Today? Per triage note Madden Garron 18y male [facility] accompanied by his grandparents. PT is unsure of any official mental health diagnosis. PT explains he has been feeling restless for the past month, stressed, constant pacing, having agitation and outbursts. PT states he feels he needs a medication adjustment. PT is observed in triage - cannot sit still, pacing in triage. PT denies SI, HI, AVH and alcohol and substance use.  How Long Has This Been Causing You Problems? 1 wk - 1 month  What Do You Feel Would Help You the Most Today? Treatment for Depression or other mood problem; Medication(s)   Have You Recently Had Any Thoughts About Hurting Yourself? No  Are You Planning to Commit Suicide/Harm Yourself At This time? No   Flowsheet Row ED from 02/15/2024 in [facility] Office Visit from 02/07/2024 in [facility] Office Visit from 01/08/2024 in [facility]  C-SSRS RISK CATEGORY No Risk No Risk Error: Q3, 4, or 5 should not be populated when Q2 is No    Have you Recently Had Thoughts About Hurting Someone [name]? No  Are You Planning to Harm Someone at This Time? No  Explanation: Pt denies HI   Have You Used Any Alcohol or Drugs in the Past 24 Hours? No  How Long  Ago Did You Use Drugs or Alcohol? N/a What Did You Use and How Much? N/a  Do You Currently Have a Therapist/Psychiatrist? Yes  Name of Therapist/Psychiatrist: Name of Therapist/Psychiatrist: Dr.Hisada-psychiatry   Have You Been Recently Discharged From Any Office Practice or Programs? No  Explanation of Discharge From Practice/Program: D/C from [facility] on 10/31/23     CCA Screening Triage Referral Assessment Type of Contact: Face-to-Face  Telemedicine Service Delivery:   Is this Initial or Reassessment?   Date Telepsych consult ordered in [facility]:    Time Telepsych consult ordered in [facility]:    Location of Assessment: [facility] Assessment Services  Provider Location: [facility]   Collateral Involvement: Lawence Lighter, mother [phone]   Does Patient Have a Court Appointed Legal Guardian? No  Legal Guardian Contact Information: n/a  Copy of Legal Guardianship Form: -- (n/a)  Legal Guardian Notified of Arrival: -- (n/a)  Legal Guardian Notified of Pending Discharge: -- (n/a)  If Minor and Not Living with Parent(s), Who has Custody? n/a  Is CPS involved or ever been involved? Never  Is APS involved or ever been involved? Never   Patient Determined To Be At Risk for Harm To Self or Others Based on Review of Patient Reported Information or Presenting Complaint? No  Method: No Plan  Availability of Means: No access or NA  Intent: Vague intent or NA  Notification Required: No need or identified person  Additional Information for Danger to Others Potential: -- (n/a)  Additional Comments for Danger to Others Potential: n/a  Are There Guns or Other Weapons in Your Home? No  Types of Guns/Weapons: denies access to weapons  Are These Weapons Safely Secured?                            -- (n/a)  Who Could Verify You Are Able To Have These Secured: denies access to weapons  Do You Have any Outstanding Charges, Pending Court Dates, Parole/Probation?  none reported  Contacted To Inform of Risk of Harm To Self or Others: Family/Significant Other:    Does Patient Present under Involuntary Commitment? No    [place] of Residence: [place]   Patient Currently Receiving the Following Services: Medication Management   Determination of Need: Urgent (48 hours)   Options For Referral: Medication Management; Outpatient Therapy; [facility] Urgent Care     CCA Biopsychosocial Patient Reported Schizophrenia/Schizoaffective Diagnosis in Past: Yes   Strengths: Patient is able to communicate, has good support with his family   Mental Health Symptoms Depression:  Difficulty Concentrating; Fatigue; Irritability; Change in energy/activity; Sleep (too much or little); Increase/decrease in appetite   Duration of Depressive symptoms: Duration of Depressive Symptoms: Greater than two weeks   Mania:  Racing thoughts   Anxiety:   Difficulty concentrating; Restlessness; Worrying; Tension   Psychosis:  None   Duration of Psychotic symptoms:    Trauma:  None   Obsessions:  None   Compulsions:  N/A   Inattention:  None   Hyperactivity/Impulsivity:  None   Oppositional/Defiant Behaviors:  None   Emotional Irregularity:  N/A   Other Mood/Personality Symptoms:  n/a    Mental Status Exam Appearance and self-care  Stature:  Average   Weight:  Average weight  Clothing:  Casual   Grooming:  Normal   Cosmetic use:  None   Posture/gait:  Tense   Motor activity:  Restless   Sensorium  Attention:  Persistent   Concentration:  Anxiety interferes   Orientation:  X5   Recall/memory:  Normal   Affect and Mood  Affect:  Anxious   Mood:  Anxious   Relating  Eye contact:  Normal   Facial expression:  Responsive; Anxious   Attitude toward examiner:  Cooperative   Thought and Language  Speech flow: Clear and Coherent; Soft   Thought content:  Appropriate to Mood and Circumstances   Preoccupation:  None    Hallucinations:  None   Organization:  Goal-directed   [employer] of Knowledge:  Fair   Intelligence:  Average   Abstraction:  Functional   Judgement:  Fair   Reality Testing:  Distorted; Variable   Insight:  Fair   Decision Making:  Confused; Vacilates   Social Functioning  Social Maturity:  Responsible   Social Judgement:  Normal   Stress  Stressors:  Illness; Other (Comment) (mental health)   Coping Ability:  Exhausted   Skill Deficits:  Communication   Supports:  Family; Friends/Service system     Religion: Religion/Spirituality Are You A Religious Person?: No How Might This Affect Treatment?: n/a  Leisure/Recreation: Leisure / Recreation Do You Have Hobbies?: Yes Leisure and Hobbies: Listening to music  Exercise/Diet: Exercise/Diet Do You Exercise?: No Have You Gained or Lost A Significant Amount of Weight in the Past Six Months?: No Do You Follow a Special Diet?: No Do You Have Any Trouble Sleeping?: Yes Explanation of Sleeping Difficulties: Pt reports, trouble sleeping sometimes due to restlessness   CCA Employment/Education Employment/Work Situation: Employment / Work Situation Employment Situation: Unemployed Patient's Job has Been Impacted by Current Illness: No Has Patient ever Been in Equities Trader?: No  Education: Education Is Patient Currently Attending School?: No Last Grade Completed: 12 Did You Product Manager?: No Did You Have An Individualized Education Program (IIEP): No Did You Have Any Difficulty At [employer]?: No Patient's Education Has Been Impacted by Current Illness: No   CCA Family/Childhood History Family and Relationship History: Family history Marital status: Single Does patient have children?: No  Childhood History:  Childhood History By whom was/is the patient raised?: Both parents, Grandparents Did patient suffer any verbal/emotional/physical/sexual abuse as a child?: No Did patient suffer  from severe childhood neglect?: No Has patient ever been sexually abused/assaulted/raped as an adolescent or adult?: No Was the patient ever a victim of a crime or a disaster?: No Witnessed domestic violence?: No Has patient been affected by domestic violence as an adult?: No       CCA Substance Use Alcohol/Drug Use: Alcohol / Drug Use Pain Medications: see mar Prescriptions: see mar Over the Counter: see mar History of alcohol / drug use?: No history of alcohol / drug abuse                         ASAM's:  Six Dimensions of Multidimensional Assessment  Dimension 1:  Acute Intoxication and/or Withdrawal Potential:      Dimension 2:  Biomedical Conditions and Complications:      Dimension 3:  Emotional, Behavioral, or Cognitive Conditions and Complications:     Dimension 4:  Readiness to Change:     Dimension 5:  Relapse, Continued use, or Continued Problem Potential:     Dimension 6:  Recovery/Living  Environment:     ASAM Severity Score:    ASAM Recommended Level of Treatment:     Substance use Disorder (SUD)    Recommendations for Services/Supports/Treatments: Recommendations for Services/Supports/Treatments Recommendations For Services/Supports/Treatments: Individual Therapy, Medication Management, Inpatient Hospitalization  Disposition Recommendation per psychiatric provider: Observation   DSM5 Diagnoses: Patient Active Problem List   Diagnosis Date Noted   Low vitamin D  level 11/06/2023   Cannabis use disorder, mild, abuse 11/06/2023   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

## 2024-02-17 NOTE — Progress Notes (Unsigned)
 BH MD/PA/NP OP Progress Note  02/17/2024 10:36 AM Caleb Reid  MRN:  [id number]  Chief Complaint: No chief complaint on file.  HPI: ***  Propranolol  started  Visit Diagnosis: No diagnosis found.  Past Psychiatric History: Please see initial evaluation for full details. I have reviewed the history. No updates at this time.     Past Medical History:  Past Medical History:  Diagnosis Date   Schizophrenia [facility])     Past Surgical History:  Procedure Laterality Date   HERNIA REPAIR      Family Psychiatric History: Please see initial evaluation for full details. I have reviewed the history. No updates at this time.     Family History:  Family History  Problem Relation Age of Onset   Depression Mother    Schizophrenia Maternal Grandmother     Social History:  Social History   Socioeconomic History   Marital status: Single    Spouse name: Not on file   Number of children: Not on file   Years of education: Not on file   Highest education level: High school graduate  Occupational History   Not on file  Tobacco Use   Smoking status: Never    Passive exposure: Yes   Smokeless tobacco: Never  Vaping Use   Vaping status: Never Used  Substance and Sexual Activity   Alcohol use: No   Drug use: Not Currently    Types: Marijuana   Sexual activity: Never  Other Topics Concern   Not on file  Social History Narrative   Not on file   Social Drivers of Health   Financial Resource Strain: Not on file  Food Insecurity: No Food Insecurity (02/16/2024)   Hunger Vital Sign    Worried About Running Out of Food in the Last Year: Never true    Ran Out of Food in the Last Year: Never true  Transportation Needs: No Transportation Needs (02/16/2024)   PRAPARE - Administrator, Civil Service (Medical): No    Lack of Transportation (Non-Medical): No  Physical Activity: Not on file  Stress: Not on file  Social Connections: Not on file    Allergies: No Known  Allergies  Metabolic Disorder Labs: Lab Results  Component Value Date   HGBA1C 4.8 02/16/2024   MPG 91.06 02/16/2024   MPG 91.06 11/06/2023   Lab Results  Component Value Date   PROLACTIN 5.4 01/16/2021   Lab Results  Component Value Date   CHOL 167 02/16/2024   TRIG 97 02/16/2024   HDL 43 02/16/2024   CHOLHDL 3.9 02/16/2024   VLDL 19 02/16/2024   LDLCALC 105 (H) 02/16/2024   LDLCALC 67 11/06/2023   Lab Results  Component Value Date   TSH 6.561 (H) 02/16/2024   TSH 3.866 11/02/2023    Therapeutic Level Labs: No results found for: LITHIUM Lab Results  Component Value Date   VALPROATE <10 (L) 02/16/2024   VALPROATE 64 11/14/2023   No results found for: CBMZ  Current Medications: Current Outpatient Medications  Medication Sig Dispense Refill   nicotine  (NICODERM CQ  - DOSED IN MG/24 HOURS) 14 mg/24hr patch Place 1 patch (14 mg total) onto the skin daily. (Patient not taking: No sig reported) 28 patch 0   OLANZapine  (ZYPREXA ) 5 MG tablet Take 1 tablet (5 mg total) by mouth at bedtime. (Patient taking differently: Take 7.5 mg by mouth at bedtime.) 30 tablet 0   propranolol  (INDERAL ) 10 MG tablet Take 1 tablet (10 mg total) by mouth  2 (two) times daily. (Patient not taking: No sig reported) 60 tablet 0   propranolol  (INDERAL ) 10 MG tablet Take 1 tablet (10 mg total) by mouth 3 (three) times daily as needed (akathesia). 12 tablet 0   Vitamin D , Ergocalciferol , (DRISDOL ) 1.25 MG (50000 UNIT) CAPS capsule Take 1 capsule (50,000 Units total) by mouth every 7 (seven) days. (Patient not taking: No sig reported) 5 capsule 0   No current facility-administered medications for this visit.     Musculoskeletal: Strength & Muscle Tone: within normal limits Gait & Station: normal Patient leans: N/A  Psychiatric Specialty Exam: Review of Systems  There were no vitals taken for this visit.There is no height or weight on file to calculate BMI.  General Appearance:  {Appearance:22683}  Eye Contact:  {BHH EYE CONTACT:22684}  Speech:  Clear and Coherent  Volume:  Normal  Mood:  {BHH MOOD:22306}  Affect:  {Affect (PAA):22687}  Thought Process:  Coherent  Orientation:  Full (Time, Place, and Person)  Thought Content: Logical   Suicidal Thoughts:  {ST/HT (PAA):22692}  Homicidal Thoughts:  {ST/HT (PAA):22692}  Memory:  Immediate;   Good  Judgement:  {Judgement (PAA):22694}  Insight:  {Insight (PAA):22695}  Psychomotor Activity:  Normal  Concentration:  Concentration: Good and Attention Span: Good  Recall:  Good  Fund of Knowledge: Good  Language: Good  Akathisia:  No  Handed:  Right  AIMS (if indicated): not done  Assets:  Communication Skills Desire for Improvement  ADL's:  Intact  Cognition: WNL  Sleep:  {BHH GOOD/FAIR/POOR:22877}   Screenings: AUDIT    Flowsheet Row [facility] Ad[facility] (Discharged) from 10/19/2022 in [facility]  Alcohol Use Disorder Identification Test Final Score (AUDIT) 0 0   GAD-7    Flowsheet Row Office Visit from 02/07/2024 in [facility]  Total GAD-7 Score 12   PHQ2-9    Flowsheet Row Office Visit from 02/07/2024 in [facility] Office Visit from 01/08/2024 in [facility]  PHQ-2 Total Score 6 0  PHQ-9 Total Score 14 --   Flowsheet Row ED from 02/15/2024 in [facility] Office Visit from 02/07/2024 in [facility] Office Visit from 01/08/2024 in [facility]  C-SSRS RISK CATEGORY No Risk No Risk Error: Q3, 4, or 5 should not be populated when Q2 is No     Assessment and Plan:  Caleb Reid is a 18 y.o. year old male with a history of undifferentiated schizophrenia, depression, cannabis  use, who presents for follow up appointment for below.   1. Paranoid schizophrenia (HCC) # r/o substance induced mood disorder # irritability History: started to experience paranoia at age 36. Admitted to [facility] 7-11/2023 with previous admissions for schizophrenia There has been overall improvement in irritability.  Although he continues to report AH twice since the last visit, he does not recall the details.  It is notable that although he still has occasional difficulty with concentration, he appears to be improving compared to the previous visit/since being on olanzapine .  He also reports less akathisia.  Will do further uptitration to optimize treatment for paranoid schizophrenia.  Noted that although he will benefit from LAI, there is concern of akathisia from Abilify , and Risperdal reported may cause worsening in his condition.  He was advised again to see if his insurance covers cobenfy.  Will plan to do close monitoring until stabilization of his symptoms.    2. Akathisia - propranolol , mirtazapine , gabapentin /pregabalin /, mirtazapine , clonazepam  with limited benefit per patient    The exam is notable for a little more calmer affect, and he reports slight improvement in akathisia since switching from Vraylar to olanzapine .  Will start amantadine to target akathisia, off label.    4. Marijuana use disorder - uds +marijuana 10/2023 He denies marijuana use in the last few days.Psychoeducation is provided again regarding his risk of worsening psychosis.     3. High risk medication use       Last checked  EKG HR 84, QTc477msec 02/2024  Lipid panels LDL105 H 02/2024  HbA1c 4.8 02/2024    # SW consult He has limited stability in the house, living with his great-grandmother with dementia.  His father is not in the picture currently, and his mother is homeless.  He is open to consider group home. Referral was made for social worker.      Plan Increase olanzapine  7.5 mg at night   Start amantadine 100 mg twice a day Please look into website for [facility]  Next appointment- 11/11 at 11 am, IP   Past trials- Abilify  (akathisia),  risperidone (worsening in symptoms), olanzapine , Vraylar (akathisia), clozapine, Depakote  (worsening in irritability), Trazodone  (tingling sensation), benztropine  (more pacing), propranolol  (pacing), Pregabalin , hydroxyzine    The patient demonstrates the following risk factors for suicide: Chronic risk factors for suicide include: psychiatric disorder of schizophrenia and substance use disorder. Acute risk factors for suicide include: unemployment and recent discharge from inpatient psychiatry. Protective factors for this patient include: positive social support and hope for the future. Considering these factors, the overall suicide risk at this point appears to be low. Patient is appropriate for outpatient follow up. He denies gun access at home. Emergency resources which includes 911, ED, suicide crisis line (988) are discussed.   Collaboration of Care: Collaboration of Care: {BH OP Collaboration of Care:21014065}  Patient/Guardian was advised Release of Information must be obtained prior to any record release in order to collaborate their care with an outside provider. Patient/Guardian was advised if they have not already done so to contact the registration department to sign all necessary forms in order for us  to release information regarding their care.   Consent: Patient/Guardian gives verbal consent for treatment and assignment of benefits for services provided during this visit. Patient/Guardian expressed understanding and agreed to proceed.    Katheren Sleet, MD 02/17/2024, 10:36 AM

## 2024-02-19 ENCOUNTER — Ambulatory Visit (INDEPENDENT_AMBULATORY_CARE_PROVIDER_SITE_OTHER): Payer: MEDICAID | Admitting: Psychiatry

## 2024-02-19 ENCOUNTER — Encounter: Payer: Self-pay | Admitting: Psychiatry

## 2024-02-19 ENCOUNTER — Other Ambulatory Visit: Payer: Self-pay

## 2024-02-19 VITALS — BP 136/87 | HR 94 | Temp 98.2°F | Ht 67.0 in | Wt 150.4 lb

## 2024-02-19 DIAGNOSIS — G2571 Drug induced akathisia: Secondary | ICD-10-CM

## 2024-02-19 DIAGNOSIS — Z79899 Other long term (current) drug therapy: Secondary | ICD-10-CM | POA: Diagnosis not present

## 2024-02-19 DIAGNOSIS — F200 Paranoid schizophrenia: Secondary | ICD-10-CM

## 2024-02-19 MED ORDER — BENZTROPINE MESYLATE 0.5 MG PO TABS: 0.5000 mg | ORAL_TABLET | Freq: Two times a day (BID) | ORAL | 0 refills | Status: DC

## 2024-02-19 MED ORDER — OLANZAPINE 5 MG PO TABS: 7.5000 mg | ORAL_TABLET | Freq: Every day | ORAL | 0 refills | Status: AC

## 2024-02-19 NOTE — Patient Instructions (Signed)
 Continue olanzapine  7.5 mg at night  Start benztropine  0.5 mg twice a day Hold amantadin/propranolol  Next appointment- 1/8 at 10:30

## 2024-02-19 NOTE — Progress Notes (Signed)
 (This note is copied from the other note created today due to technical issues)  BH MD/PA/NP OP Progress Note  02/19/2024 12:34 PM Caleb Reid  MRN:  [id number]  Chief Complaint:  Chief Complaint  Patient presents with   Follow-up   HPI:  This is a follow-up appointment for schizophrenia and akathisia.  He states that he thinks higher dose of olanzapine  is helping.  It took away the voices.  He wonders if the dose could be uptitrated as he still experiences some voices when he says certain things.  He states that the voices is random, and denies any CAH.  He states that his grandmother is doing all right.  He was kicked out from his grandmother's place related to the recent interaction.  He states that he said something out of frustration.  He stays at his part-time now grandparents now.  He continues to maintain good relationship with his mother.  He continues to do pacing.  Although he was prescribed propranolol  at [facility], he had worsening in pacing, and had a rash on his neck.  He denies any shortness of breath or any anaphylactic like symptoms.  He sleeps 7 hours with middle insomnia.  He feels down at times due to the restlessness/anxiety.  He denies SI, HI.  He has not drink alcohol or use marijuana since the last visit.  He would like to get pharmacogenetics testing.  Although he was initially open with CST program, he later states that he does not need that program as he feels comfortable to come to the outpatient follow up.  He acknowledges that we are in the process of inquiring if any male provider could take over the care as per his request.  Noted that he was initially under the impression that only the male provider could prescribe him benzodiazepine.  It was discussed with him that while this writer does prescribe benzodiazepine if applicable, he had adverse reaction from clonazepam . He expressed understanding of this. He answers don't know if he still wants to pursue the transfer. He  agrees to discuss whether or not to proceed with the transfer of care with his mother, who appears to be more interested in pursuing this, while honoring his choice.  Wt Readings from Last 3 Encounters:  02/19/24 150 lb 6.4 oz (68.2 kg) (43%, Z= -0.18)*  02/07/24 152 lb (68.9 kg) (46%, Z= -0.11)*  01/22/24 148 lb 6.4 oz (67.3 kg) (40%, Z= -0.26)*   * Growth percentiles are based on CDC (Boys, 2-20 Years) data.      Visit Diagnosis:    ICD-10-CM   1. Paranoid schizophrenia (HCC)  F20.0 [facility] Mental Health Panel    2. Akathisia  G25.71 [facility] Mental Health Panel    3. High risk medication use  Z79.899 [facility] Mental Health Panel      Past Psychiatric History: Please see initial evaluation for full details. I have reviewed the history. No updates at this time.     Past Medical History:  Past Medical History:  Diagnosis Date   Schizophrenia [facility])     Past Surgical History:  Procedure Laterality Date   HERNIA REPAIR      Family Psychiatric History: Please see initial evaluation for full details. I have reviewed the history. No updates at this time.     Family History:  Family History  Problem Relation Age of Onset   Depression Mother    Schizophrenia Maternal Grandmother     Social  History:  Social History   Socioeconomic History   Marital status: Single    Spouse name: Not on file   Number of children: Not on file   Years of education: Not on file   Highest education level: High school graduate  Occupational History   Not on file  Tobacco Use   Smoking status: Never    Passive exposure: Yes   Smokeless tobacco: Never  Vaping Use   Vaping status: Never Used  Substance and Sexual Activity   Alcohol use: No   Drug use: Not Currently    Types: Marijuana   Sexual activity: Never  Other Topics Concern   Not on file  Social History Narrative   Not on file   Social Drivers of Health    Financial Resource Strain: Not on file  Food Insecurity: No Food Insecurity (02/16/2024)   Hunger Vital Sign    Worried About Running Out of Food in the Last Year: Never true    Ran Out of Food in the Last Year: Never true  Transportation Needs: No Transportation Needs (02/16/2024)   PRAPARE - Administrator, Civil Service (Medical): No    Lack of Transportation (Non-Medical): No  Physical Activity: Not on file  Stress: Not on file  Social Connections: Not on file    Allergies: No Known Allergies  Metabolic Disorder Labs: Lab Results  Component Value Date   HGBA1C 4.8 02/16/2024   MPG 91.06 02/16/2024   MPG 91.06 11/06/2023   Lab Results  Component Value Date   PROLACTIN 5.4 01/16/2021   Lab Results  Component Value Date   CHOL 167 02/16/2024   TRIG 97 02/16/2024   HDL 43 02/16/2024   CHOLHDL 3.9 02/16/2024   VLDL 19 02/16/2024   LDLCALC 105 (H) 02/16/2024   LDLCALC 67 11/06/2023   Lab Results  Component Value Date   TSH 6.561 (H) 02/16/2024   TSH 3.866 11/02/2023    Therapeutic Level Labs: No results found for: LITHIUM Lab Results  Component Value Date   VALPROATE <10 (L) 02/16/2024   VALPROATE 64 11/14/2023   No results found for: CBMZ  Current Medications: Current Outpatient Medications  Medication Sig Dispense Refill   nicotine  (NICODERM CQ  - DOSED IN MG/24 HOURS) 14 mg/24hr patch Place 1 patch (14 mg total) onto the skin daily. (Patient not taking: No sig reported) 28 patch 0   OLANZapine  (ZYPREXA ) 5 MG tablet Take 1 tablet (5 mg total) by mouth at bedtime. (Patient taking differently: Take 7.5 mg by mouth at bedtime.) 30 tablet 0   propranolol  (INDERAL ) 10 MG tablet Take 1 tablet (10 mg total) by mouth 2 (two) times daily. (Patient not taking: No sig reported) 60 tablet 0   propranolol  (INDERAL ) 10 MG tablet Take 1 tablet (10 mg total) by mouth 3 (three) times daily as needed (akathesia). 12 tablet 0   Vitamin D , Ergocalciferol ,  (DRISDOL ) 1.25 MG (50000 UNIT) CAPS capsule Take 1 capsule (50,000 Units total) by mouth every 7 (seven) days. (Patient not taking: No sig reported) 5 capsule 0   No current facility-administered medications for this visit.     Musculoskeletal: Strength & Muscle Tone: within normal limits Gait & Station: normal Patient leans: N/A  Psychiatric Specialty Exam: Review of Systems  Blood pressure 136/87, pulse 94, temperature 98.2 F (36.8 C), temperature source Temporal, height 5' 7 (1.702 m), weight 150 lb 6.4 oz (68.2 kg).Body mass index is 23.56 kg/m.  General Appearance: Well Groomed  Eye Contact:  Good  Speech:  Clear and Coherent  Volume:  Normal  Mood:  Anxious  Affect:  Restricted- less restricted compared to the previous visit  Thought Process:  Coherent  Orientation:  Full (Time, Place, and Person)  Thought Content: Logical   Suicidal Thoughts:  No  Homicidal Thoughts:  No  Memory:  Immediate;   Good  Judgement:  Good  Insight:  Fair  Psychomotor Activity:  increased. Pacing during the entire visit  Concentration:  Concentration: Fair and Attention Span: Fair  Recall:  Good  Fund of Knowledge: Good  Language: Good  Akathisia:  Yes  Handed:  Right  AIMS (if indicated): not done  Assets:  Communication Skills Desire for Improvement  ADL's:  Intact  Cognition: WNL  Sleep:  Fair   Screenings: AUDIT    Flowsheet Row [facility] Ad[facility] (Discharged) from 10[facility]19/2022 in [facility]  Alcohol Use Disorder Identification Test Final Score (AUDIT) 0 0   GAD-7    Flowsheet Row Office Visit from 02/07/2024 in [facility]  Total GAD-7 Score 12   PHQ2-9    Flowsheet Row Office Visit from 02/07/2024 in [facility] Office Visit from 01/08/2024 in [facility]  PHQ-2 Total Score 6 0  PHQ-9 Total Score 14 --   Flowsheet Row ED from 02/15/2024 in [facility] Office Visit from 02/07/2024 in [facility] Office Visit from 01/08/2024 in [facility]  C-SSRS RISK CATEGORY No Risk No Risk Error: Q3, 4, or 5 should not be populated when Q2 is No     Assessment and Plan:  Akira Adelsberger is a 18 y.o. year old male with a history of undifferentiated schizophrenia, depression, cannabis use, who presents for follow up appointment for below.  1. Paranoid schizophrenia (HCC) History: started to experience paranoia at age 5. Admitted to [facility] [place] 7-11/2023 with previous admissions for schizophrenia Although there has been overall improvement in irritability since uptitration of olanzapine , he continues to experience significant.  Although it was discussed to consider switching to the other antipsychotic such as lumateperone, he reports interest in intervention of akathisia as outlined below.  Will try this first while maintaining olanzapine  as it has been effective for his psychotic symptoms/hallucinations.  Noted that although he will benefit from LAI, there is concern of akathisia from Abilify , and Risperdal reportedly caused worsening in his condition.  They (patient and his mother) reported they tried clozapine with limited benefit, although this writer did not find this in the record.   2. Akathisia - propranolol , mirtazapine , gabapentin /pregabalin /, mirtazapine , clonazepam  with limited benefit per patient    He continues to experience akathisia, pacing during the visit, although he appears somewhat calmer on today's evaluation.  Noted that he went to the ED due to the same complaint.  He tried the propranolol  again, which caused worsening restlessness.  Although he did try benztropine  in the past with adverse reaction, he has strong preference  to try this again as he was also on other medications at that time.  Although this is very limited evidence for akathisia, will pursue this first to ensure his medication adherence/treatment.  He expressed understanding to switch from olanzapine  to lumateperone if this does not work.  He agrees to notify the office if any worsening in symptoms.   4.  Marijuana use disorder - uds +marijuana 10/2023 He denies marijuana use i since the last visit.  Psychoeducation is provided again regarding his risk of worsening psychosis.     3. High risk medication use      Last checked  EKG HR 84, QTc427msec 02/2024  Lipid panels LDL105 H 02/2024  HbA1c 4.8 02/2024    # SW consult He had limited stability in the house, living with his great-grandmother with dementia.  He now moved into his paternal grandparents.  His father is not in the picture currently, and his mother is homeless.  Referral was  previously sent for social work for as he was interested in group home.      Plan Continue olanzapine  7.5 mg at night  Start benztropine  0.5 mg twice a day Obtained pharmaco genomic testing Hold amantadine/propranolol  Next appointment- 1/8 at 10:30, IP   Past trials- Abilify  (akathisia),  risperidone (worsening in symptoms), olanzapine , Vraylar (akathisia), clozapine, Depakote  (worsening in irritability), Trazodone  (tingling sensation), benztropine  (more pacing), propranolol  (pacing), Pregabalin , hydroxyzine    The patient demonstrates the following risk factors for suicide: Chronic risk factors for suicide include: psychiatric disorder of schizophrenia and substance use disorder. Acute risk factors for suicide include: unemployment and recent discharge from inpatient psychiatry. Protective factors for this patient include: positive social support and hope for the future. Considering these factors, the overall suicide risk at this point appears to be low. Patient is appropriate for outpatient follow up. He denies  gun access at home. Emergency resources which includes 911, ED, suicide crisis line (988) are discussed.   Collaboration of Care: Collaboration of Care: Other reviewed notes in Epic  Patient/Guardian was advised Release of Information must be obtained prior to any record release in order to collaborate their care with an outside provider. Patient/Guardian was advised if they have not already done so to contact the registration department to sign all necessary forms in order for us  to release information regarding their care.   Consent: Patient/Guardian gives verbal consent for treatment and assignment of benefits for services provided during this visit. Patient/Guardian expressed understanding and agreed to proceed.    Katheren Sleet, MD 02/19/2024, 12:34 PM

## 2024-02-20 ENCOUNTER — Other Ambulatory Visit: Payer: Self-pay | Admitting: Psychiatry

## 2024-02-20 ENCOUNTER — Telehealth: Payer: Self-pay

## 2024-02-20 ENCOUNTER — Telehealth: Payer: Self-pay | Admitting: Psychiatry

## 2024-02-20 MED ORDER — LUMATEPERONE TOSYLATE 42 MG PO CAPS: 42.0000 mg | ORAL_CAPSULE | Freq: Every day | ORAL | 0 refills | Status: DC

## 2024-02-20 NOTE — Telephone Encounter (Signed)
 Patient called stating that he feels like the Olanzapine  may be causing his restless legs because he stopped the medication for a few days and it got better he is wanting to know if he can just stop the medication or does he have to taper off please advise

## 2024-02-20 NOTE — Telephone Encounter (Signed)
 Called patient inform of the message from provider he voiced understanding

## 2024-02-20 NOTE — Telephone Encounter (Signed)
 We reached out to providers in [place] and at the resident clinic in response to the patient's request to transfer to a male provider. All consulted providers recommended the ACT team at this time, which I agree with, given that his care requires more frequent visits to support medication adjustments for stabilization. Our team will discuss this recommendation/referral to [facility] with the patient and his mother to determine how best to proceed.

## 2024-02-20 NOTE — Telephone Encounter (Signed)
 We discussed to switch olanzapine  to lumateprone if benztropine  does not work. Please advise him to start this medication instead of olanzapine - I will send this to the pharmacy.

## 2024-02-20 NOTE — Telephone Encounter (Signed)
 Medication management - Message left for patient on his voicemail regarding his desire to change to a male psychiatrist and informed patient on message mulitiple providers had reviewed his case and agreed with Dr. Hisada that patient may could benefit more from a higher level of care with an ACTT Team in his community.  Informed patient on message that [facility] in [place], [phone] has a ACTT team and also [employer] in their area as well.  Informed patient the recommendation was based on patient's probable need for more visits with the psychiatrist than just onc every month to two and with an ACTT team would have a psychiatrist and therapist team that would see patient more often, usually at least once a week and could make more medication adjustments faster.  Requested patient consider this and to call our office back if would like to know more and to assist him with the referral and also if he chose to stay with Dr. Hisada, realizing he would not be able to see her weekly or even every 2 weeks most likely simply based on availability and that we wanted to make sure patient was getting increased care he felt he was needing at this time.

## 2024-03-03 ENCOUNTER — Telehealth: Payer: Self-pay

## 2024-03-03 ENCOUNTER — Telehealth: Payer: Self-pay | Admitting: Psychiatry

## 2024-03-03 ENCOUNTER — Other Ambulatory Visit: Payer: Self-pay | Admitting: Psychiatry

## 2024-03-03 MED ORDER — COBENFY 50-20 MG PO CAPS: 1.0000 | ORAL_CAPSULE | Freq: Two times a day (BID) | ORAL | 0 refills | Status: DC

## 2024-03-03 NOTE — Telephone Encounter (Signed)
 Referral was sent over by [facility] & [facility] @ 2:15pm to [facility].    JNL

## 2024-03-03 NOTE — Telephone Encounter (Signed)
 Discussed with the patient.  He states that he was having more akathisia, feeling restless when he tried lumateperone .  He was unable to play video game due to this restlessness, although he was able to do it when he was on olanzapine .  He switched back to olanzapine  1.5 days ago.  He feels better, and feels less irritable. He denies SI, HI, hallucinations.  Discussed regarding pharmacodynamic testing.  He experienced adverse reactions to medications, including antipsychotics, despite being classified as a "normal" metabolizer.  It is also discussed to consider referral to ACT team. He was not aware of the voice message left by the nurse, but has agreed to proceed with this. After discussing treatment options, he agrees with the following.   - Start Cobenfy  50 mg twice a day (stay on this dose due to him having adverse reaction to many psychotropics). LFT wnl in Nov 2025 (If the above does not work, he agrees to try clozapine) - Referral to ACT team - waitlist for sooner visit.

## 2024-03-03 NOTE — Telephone Encounter (Signed)
 Referral sent to [facility] via fax [phone]) confirmation received

## 2024-03-03 NOTE — Telephone Encounter (Addendum)
 PA has been initiated via cover my meds for Pt's 50-20mg  COBENFY  capsules. Awaiting decision. LVM to PT  Started on 03/03/2024-   JNL

## 2024-03-03 NOTE — Telephone Encounter (Signed)
 Hello,   Pt states, that he is experiencing  akathisia, experiencing a lot of pacing for several of months and states there is a lot of restlessness and anger issuers. States that his CAPLYTA  is not effective. Please reach out out to patient anytime he states.   JNL

## 2024-03-03 NOTE — Telephone Encounter (Signed)
 Spoke with the patient, and details in the note.   Could you make referral to [facility] in [place] for ACT team? He agreed with this referral. Thanks.

## 2024-03-04 ENCOUNTER — Telehealth: Payer: Self-pay

## 2024-03-04 ENCOUNTER — Encounter: Payer: Self-pay | Admitting: Psychiatry

## 2024-03-04 ENCOUNTER — Other Ambulatory Visit: Payer: Self-pay

## 2024-03-04 ENCOUNTER — Ambulatory Visit (INDEPENDENT_AMBULATORY_CARE_PROVIDER_SITE_OTHER): Payer: MEDICAID | Admitting: Psychiatry

## 2024-03-04 VITALS — BP 126/77 | HR 85 | Temp 97.9°F | Ht 67.0 in | Wt 150.6 lb

## 2024-03-04 DIAGNOSIS — F200 Paranoid schizophrenia: Secondary | ICD-10-CM

## 2024-03-04 DIAGNOSIS — Z79899 Other long term (current) drug therapy: Secondary | ICD-10-CM

## 2024-03-04 DIAGNOSIS — G2571 Drug induced akathisia: Secondary | ICD-10-CM

## 2024-03-04 DIAGNOSIS — F1290 Cannabis use, unspecified, uncomplicated: Secondary | ICD-10-CM

## 2024-03-04 NOTE — Progress Notes (Signed)
 [facility] MD/PA/NP OP Progress Note  03/04/2024 7:58 AM Caleb Reid  MRN:  [id number]  Chief Complaint:  Chief Complaint  Patient presents with   Follow-up   HPI:  This is a follow-up appointment for schizophrenia, akathisia.  This appointment is made urgently due to the patient concern of his symptoms.  He continues to pace.  However, he was able to play video games since being off Caplyta .  He was feeling more restless when he was taking this medication.  He was able to sleep 7 hours last night.  Although he feels calm down since being on olanzapine , he continues to feel restless and experiences akathisia.  He states that he cannot get a job due to his symptoms.  He feels getting irritable easily, although he denies any violence.  He tends to feel this way when he has to speak to somebody as he cannot relax.  He thinks his mood is decent,  although he occasionally feels down.  He denies SI, HI.  Although he has some AH of hearing something, he does not remember what it is.  Although he has not picked up Cobenfy  yet, he is willing to start it this morning.  Although he has not talked with his mother about ACT team, he feels comfortable proceeding with it, stating that if it helps me.  He agrees with the plans as outlined below.    Wt Readings from Last 3 Encounters:  03/04/24 150 lb 9.6 oz (68.3 kg) (43%, Z= -0.18)*  02/19/24 150 lb 6.4 oz (68.2 kg) (43%, Z= -0.18)*  02/07/24 152 lb (68.9 kg) (46%, Z= -0.11)*   * Growth percentiles are based on CDC (Boys, 2-20 Years) data.     Visit Diagnosis: No diagnosis found.  Past Psychiatric History: Please see initial evaluation for full details. I have reviewed the history. No updates at this time.     Past Medical History:  Past Medical History:  Diagnosis Date   Schizophrenia [facility])     Past Surgical History:  Procedure Laterality Date   HERNIA REPAIR      Family Psychiatric History: Please see initial evaluation for full details. I have  reviewed the history. No updates at this time.     Family History:  Family History  Problem Relation Age of Onset   Depression Mother    Schizophrenia Maternal Grandmother     Social History:  Social History   Socioeconomic History   Marital status: Single    Spouse name: Not on file   Number of children: Not on file   Years of education: Not on file   Highest education level: High school graduate  Occupational History   Not on file  Tobacco Use   Smoking status: Never    Passive exposure: Yes   Smokeless tobacco: Never  Vaping Use   Vaping status: Never Used  Substance and Sexual Activity   Alcohol use: No   Drug use: Not Currently    Types: Marijuana   Sexual activity: Never  Other Topics Concern   Not on file  Social History Narrative   Not on file   Social Drivers of Health   Financial Resource Strain: Not on file  Food Insecurity: No Food Insecurity (02/16/2024)   Hunger Vital Sign    Worried About Running Out of Food in the Last Year: Never true    Ran Out of Food in the Last Year: Never true  Transportation Needs: No Transportation Needs (02/16/2024)   PRAPARE -  Administrator, Civil Service (Medical): No    Lack of Transportation (Non-Medical): No  Physical Activity: Not on file  Stress: Not on file  Social Connections: Not on file    Allergies:  Allergies  Allergen Reactions   Propranolol  Hcl Rash    Metabolic Disorder Labs: Lab Results  Component Value Date   HGBA1C 4.8 02/16/2024   MPG 91.06 02/16/2024   MPG 91.06 11/06/2023   Lab Results  Component Value Date   PROLACTIN 5.4 01/16/2021   Lab Results  Component Value Date   CHOL 167 02/16/2024   TRIG 97 02/16/2024   HDL 43 02/16/2024   CHOLHDL 3.9 02/16/2024   VLDL 19 02/16/2024   LDLCALC 105 (H) 02/16/2024   LDLCALC 67 11/06/2023   Lab Results  Component Value Date   TSH 6.561 (H) 02/16/2024   TSH 3.866 11/02/2023    Therapeutic Level Labs: No results found  for: LITHIUM Lab Results  Component Value Date   VALPROATE <10 (L) 02/16/2024   VALPROATE 64 11/14/2023   No results found for: CBMZ  Current Medications: Current Outpatient Medications  Medication Sig Dispense Refill   nicotine  (NICODERM CQ  - DOSED IN MG/24 HOURS) 14 mg/24hr patch Place 1 patch (14 mg total) onto the skin daily. (Patient not taking: No sig reported) 28 patch 0   OLANZapine  (ZYPREXA ) 5 MG tablet Take 1.5 tablets (7.5 mg total) by mouth at bedtime. 45 tablet 0   Vitamin D , Ergocalciferol , (DRISDOL ) 1.25 MG (50000 UNIT) CAPS capsule Take 1 capsule (50,000 Units total) by mouth every 7 (seven) days. (Patient not taking: No sig reported) 5 capsule 0   Xanomeline-Trospium Chloride (COBENFY ) 50-20 MG CAPS Take 1 capsule by mouth 2 (two) times daily. 60 capsule 0   No current facility-administered medications for this visit.     Musculoskeletal: Strength & Muscle Tone: within normal limits Gait & Station: normal Patient leans: N/A  Psychiatric Specialty Exam: Review of Systems  Psychiatric/Behavioral:  Negative for agitation, behavioral problems, confusion, decreased concentration, dysphoric mood, hallucinations, self-injury, sleep disturbance and suicidal ideas. The patient is nervous/anxious. The patient is not hyperactive.   All other systems reviewed and are negative.   Blood pressure 126/77, pulse 85, temperature 97.9 F (36.6 C), temperature source Temporal, height 5' 7 (1.702 m), weight 150 lb 9.6 oz (68.3 kg).Body mass index is 23.59 kg/m.  General Appearance: Well Groomed  Eye Contact:  Good  Speech:  Clear and Coherent  Volume:  Normal  Mood:  restless  Affect:  Blunt  Thought Process:  Coherent  Orientation:  Full (Time, Place, and Person)  Thought Content: Logical   Suicidal Thoughts:  No  Homicidal Thoughts:  No  Memory:  Immediate;   Good  Judgement:  Good  Insight:  Fair  Psychomotor Activity:  Increased pacing during the interview except  the time he is checked for AIMS  Concentration:  Concentration: Good and Attention Span: Good  Recall:  Good  Fund of Knowledge: Good  Language: Good  Akathisia:  No  Handed:  Right  AIMS (if indicated): 0   Assets:  Communication Skills Desire for Improvement  ADL's:  Intact  Cognition: WNL  Sleep:  Good   Screenings: AUDIT    Flowsheet Row [facility] Admission (Discharged) from 10/19/2022 in [facility]  Alcohol Use Disorder Identification Test Final Score (AUDIT) 0 0   GAD-7    Flowsheet Row Office  Visit from 02/07/2024 in [facility]  Total GAD-7 Score 12   PHQ2-9    Flowsheet Row Office Visit from 02/07/2024 in [facility] Office Visit from 01/08/2024 in [facility]  PHQ-2 Total Score 6 0  PHQ-9 Total Score 14 --   Flowsheet Row ED from 02/15/2024 in [facility] Office Visit from 02/07/2024 in [facility] Office Visit from 01/08/2024 in [facility]  C-SSRS RISK CATEGORY No Risk No Risk Error: Q3, 4, or 5 should not be populated when Q2 is No     Assessment and Plan:  Brelan Hannen is a 18 y.o. year old male with a history of undifferentiated schizophrenia, depression, cannabis use, who presents for follow up appointment for below.  1. Paranoid schizophrenia (HCC) History: started to experience paranoia at age 39. Admitted to [facility] [place] 7-11/2023 with previous admissions for schizophrenia He continues to pace during the entire visit, although he appears to be calmer on today's evaluation.  He had adverse reaction worsening in akathisia, inner restlessness when he tried lumateperone .  His symptoms have been improving since being back on olanzapine   in the last 2 days, although he continues to experience some AH which he does not recall the details.  He is willing to try Cobenfy .  Discussed potential risk which includes but not limited to constipation, nausea, dizziness.  He was advised to contact the office if any concerning adverse reaction or worsening in his symptoms.   2. Akathisia - propranolol , mirtazapine , gabapentin /pregabalin , benztropine , mirtazapine , clonazepam  resulted in limited benefit/adverse reaction He had tried several psychotropics for akathisia, which resulted in limited benefit or adverse reaction.  Although olanzapine  has been effective for schizophrenia, will switch to Cobenfy  to reduce the risk of akathisia.   3. Marijuana use, continuous - uds +marijuana 10/2023 He has been abstinent from marijuana use in the last few months.  Psychoeducation is provided regarding its risk of worsening in psychosis.   4. High risk medication use     Last checked  EKG HR 84, QTc483msec 02/2024  Lipid panels LDL105 H 02/2024  HbA1c 4.8 02/2024    # [employer] genomic testing  Revviewed Pharmacogenetics testing with the patient, and provided the copy of the result.  It is noted that the medication he has tried, including olanzapine , Depakote  was indicated to use as directed.  There is no instruction about Cobenfy . Clozapine is listed as moderate gene drug interaction.  Given the patient's clinical course of experiencing adverse reactions to medications without known drug interactions, the plan is to proceed with judicious use of pharmacotherapy while closely monitoring for any adverse effects.   # ACT referral  We have reach out to male providers in [place] office is on the patient transfer request.  After the discussion with them, they all recommended to refer to ACT team.  This writer would agree with the plan given his severity of his symptoms, which requires frequent monitoring.  He agrees with this plan, and referral was made.       Plan Discontinue olanzapine  (was on 7.5 mg at night) Start Cobenfy  1 tab twice a day  Referred to ACT team Boby in [place]) Next appointment- 12/30 at 11 am, IP and 1/8 at 10:30, IP   Past trials- Abilify  (akathisia),  risperidone (worsening in symptoms), olanzapine , Vraylar  (akathisia), clozapine, Depakote  (worsening in irritability), Trazodone  (tingling sensation), benztropine  (more  pacing), propranolol  (pacing), Pregabalin , hydroxyzine    The patient demonstrates the following risk factors for suicide: Chronic risk factors for suicide include: psychiatric disorder of schizophrenia and substance use disorder. Acute risk factors for suicide include: unemployment and recent discharge from inpatient psychiatry. Protective factors for this patient include: positive social support and hope for the future. Considering these factors, the overall suicide risk at this point appears to be low. Patient is appropriate for outpatient follow up. He denies gun access at home. Emergency resources which includes 911, ED, suicide crisis line (988) are discussed.   Collaboration of Care: Collaboration of Care: Other reviewed notes in Epic  Patient/Guardian was advised Release of Information must be obtained prior to any record release in order to collaborate their care with an outside provider. Patient/Guardian was advised if they have not already done so to contact the registration department to sign all necessary forms in order for us  to release information regarding their care.   Consent: Patient/Guardian gives verbal consent for treatment and assignment of benefits for services provided during this visit. Patient/Guardian expressed understanding and agreed to proceed.    Katheren Sleet, MD 03/04/2024, 7:58 AM

## 2024-03-04 NOTE — Patient Instructions (Signed)
 Discontinue olanzapine  Start Cobenfy  1 tab twice a day  Referred to ACT team Bob[facility] in [place]) Next appointment- 12/30 at 11 am

## 2024-03-04 NOTE — Telephone Encounter (Signed)
 Prior Authorization for Cobenfy  50-20 mg capsules initiated via CoverMyMeds   Approved  Coverage 03-03-24///03-03-25   Patient informed of the approval

## 2024-03-10 ENCOUNTER — Other Ambulatory Visit: Payer: Self-pay | Admitting: Psychiatry

## 2024-03-10 ENCOUNTER — Telehealth: Payer: Self-pay

## 2024-03-10 MED ORDER — OLANZAPINE 7.5 MG PO TABS: 7.5000 mg | ORAL_TABLET | Freq: Every day | ORAL | 0 refills | Status: AC

## 2024-03-10 NOTE — Telephone Encounter (Signed)
 Please advise him to restart olanzapine  7.5 mg at this time. Please let me know if he requires a refill. If hives worsen despite discontinuation of his medication, recommend that he be evaluated at urgent care. If he develop any symptoms suggestive of anaphylaxis, such as shortness of breath, instruct him to go to the emergency room immediately.

## 2024-03-10 NOTE — Telephone Encounter (Signed)
 Patient stated that he was still not able to sit and play his video game he was still up and moving he is ok with lowering the dose of the Olanzapine  and he would also like to know if there is any other antipsychotic that can be prescribed please advise

## 2024-03-10 NOTE — Telephone Encounter (Signed)
 Called patient to inform patient of message he stated that he would start the Olanzapine  and he does need a refill but he does not have pill splitter and would like to know if the pill comes in 7.5 mg and he is also wanting to report that his restless legs are getting worst he is not able to sit down and eat his meal please advise

## 2024-03-10 NOTE — Telephone Encounter (Signed)
 Patient called to report that he is still having severe restless leg syndrome and he is not able to continue taking the Cobenfy  he feels it has caused him to have hives on his face, neck, and back he has discontinued the medication a day and a half after taking it please advise

## 2024-03-10 NOTE — Telephone Encounter (Signed)
 It has been helping him with hallucinations, and I believe he is doing better on this medication compared to others. He was even able to play video games. If he would like to lower the olanzapine  dose, we can do that if it makes him feel more comfortable.

## 2024-03-10 NOTE — Telephone Encounter (Signed)
 I have sent a separate phone message patient updated his pharmacy

## 2024-03-10 NOTE — Telephone Encounter (Signed)
 Called patient to advise of the message from provider he stated that the last time he took the Olanzapine  it did not help him it seemed to agitate him he stated that he does not know how much more of the akathisia/restless legs please advise

## 2024-03-10 NOTE — Telephone Encounter (Signed)
 Olanzapine  7.5 mg is sent to the pharmacy. Please take this and hold Cobenfy  for now. He has tried other medications for akathisia/restlessness with limited benefit or experienced side effects. I will not be able to order medication at this time. Hope his symptoms improve to some degree after discontinuation of Cobenfy . We have also sent a referral to the ACT team, so hopefully he can be seen by them sooner.

## 2024-03-10 NOTE — Telephone Encounter (Signed)
 I recommend holding off on any medication changes for now due to the recent adverse reaction he experienced with another medication. Please advise him to resume olanzapine  5 mg at this time.  - please contact the pharmacy, and cancel 7.5 mg order.

## 2024-03-12 NOTE — Telephone Encounter (Signed)
 Spoke to patient about going back on the Olanzapine  5 mg he stated that when he takes that medication he gets really aggressive and it doesn't help with his restless legs he is not taking anything currently and I also followed up with him about the rash he reported about the Cobenfy  I wanted to know if he had changed his laundry detergent or soap he stated that he did change his laundry detergent but does not feel it caused the rash. Called the patients pharmacy spoke to Tasley he will cancel the Olanzapine  7.5 mg.

## 2024-03-12 NOTE — Telephone Encounter (Signed)
 Noted, thanks. Will plan to address his concerns at the visit next week. Could you also contact the ACT team to see if the referral process could be expedited? Thanks,

## 2024-03-15 NOTE — Progress Notes (Deleted)
 BH MD/PA/NP OP Progress Note  03/15/2024 11:15 PM Caleb Reid  MRN:  [id number]  Chief Complaint: No chief complaint on file.  HPI: *** Visit Diagnosis: No diagnosis found.  Past Psychiatric History: Please see initial evaluation for full details. I have reviewed the history. No updates at this time.     Past Medical History:  Past Medical History:  Diagnosis Date   Schizophrenia [facility])     Past Surgical History:  Procedure Laterality Date   HERNIA REPAIR      Family Psychiatric History: Please see initial evaluation for full details. I have reviewed the history. No updates at this time.     Family History:  Family History  Problem Relation Age of Onset   Depression Mother    Schizophrenia Maternal Grandmother     Social History:  Social History   Socioeconomic History   Marital status: Single    Spouse name: Not on file   Number of children: Not on file   Years of education: Not on file   Highest education level: High school graduate  Occupational History   Not on file  Tobacco Use   Smoking status: Never    Passive exposure: Yes   Smokeless tobacco: Never  Vaping Use   Vaping status: Never Used  Substance and Sexual Activity   Alcohol use: No   Drug use: Not Currently    Types: Marijuana   Sexual activity: Never  Other Topics Concern   Not on file  Social History Narrative   Not on file   Social Drivers of Health   Financial Resource Strain: Not on file  Food Insecurity: No Food Insecurity (02/16/2024)   Hunger Vital Sign    Worried About Running Out of Food in the Last Year: Never true    Ran Out of Food in the Last Year: Never true  Transportation Needs: No Transportation Needs (02/16/2024)   PRAPARE - Administrator, Civil Service (Medical): No    Lack of Transportation (Non-Medical): No  Physical Activity: Not on file  Stress: Not on file  Social Connections: Not on file    Allergies:  Allergies  Allergen Reactions    Propranolol  Hcl Rash    Metabolic Disorder Labs: Lab Results  Component Value Date   HGBA1C 4.8 02/16/2024   MPG 91.06 02/16/2024   MPG 91.06 11/06/2023   Lab Results  Component Value Date   PROLACTIN 5.4 01/16/2021   Lab Results  Component Value Date   CHOL 167 02/16/2024   TRIG 97 02/16/2024   HDL 43 02/16/2024   CHOLHDL 3.9 02/16/2024   VLDL 19 02/16/2024   LDLCALC 105 (H) 02/16/2024   LDLCALC 67 11/06/2023   Lab Results  Component Value Date   TSH 6.561 (H) 02/16/2024   TSH 3.866 11/02/2023    Therapeutic Level Labs: No results found for: LITHIUM Lab Results  Component Value Date   VALPROATE <10 (L) 02/16/2024   VALPROATE 64 11/14/2023   No results found for: CBMZ  Current Medications: Current Outpatient Medications  Medication Sig Dispense Refill   nicotine  (NICODERM CQ  - DOSED IN MG/24 HOURS) 14 mg/24hr patch Place 1 patch (14 mg total) onto the skin daily. (Patient not taking: No sig reported) 28 patch 0   OLANZapine  (ZYPREXA ) 5 MG tablet Take 1.5 tablets (7.5 mg total) by mouth at bedtime. 45 tablet 0   OLANZapine  (ZYPREXA ) 7.5 MG tablet Take 1 tablet (7.5 mg total) by mouth at bedtime. 30 tablet 0  Vitamin D , Ergocalciferol , (DRISDOL ) 1.25 MG (50000 UNIT) CAPS capsule Take 1 capsule (50,000 Units total) by mouth every 7 (seven) days. (Patient not taking: No sig reported) 5 capsule 0   No current facility-administered medications for this visit.     Musculoskeletal: Strength & Muscle Tone: normal Gait & Station: normal Patient leans: N/A  Psychiatric Specialty Exam: Review of Systems  There were no vitals taken for this visit.There is no height or weight on file to calculate BMI.  General Appearance: {Appearance:22683}  Eye Contact:  {BHH EYE CONTACT:22684}  Speech:  Clear and Coherent  Volume:  Normal  Mood:  {BHH MOOD:22306}  Affect:  {Affect (PAA):22687}  Thought Process:  Coherent  Orientation:  Full (Time, Place, and Person)   Thought Content: Logical   Suicidal Thoughts:  {ST/HT (PAA):22692}  Homicidal Thoughts:  {ST/HT (PAA):22692}  Memory:  Immediate;   Good  Judgement:  {Judgement (PAA):22694}  Insight:  {Insight (PAA):22695}  Psychomotor Activity:  Normal  Concentration:  Concentration: Good and Attention Span: Good  Recall:  Good  Fund of Knowledge: Good  Language: Good  Akathisia:  No  Handed:  Right  AIMS (if indicated): not done  Assets:  Communication Skills Desire for Improvement  ADL's:  Intact  Cognition: WNL  Sleep:  {BHH GOOD/FAIR/POOR:22877}   Screenings: AUDIT    Flowsheet Row [facility] Ad[facility] (Discharged) from 10/19/2022 in [facility]  Alcohol Use Disorder Identification Test Final Score (AUDIT) 0 0   GAD-7    Flowsheet Row Office Visit from 02/07/2024 in [facility]  Total GAD-7 Score 12   PHQ2-9    Flowsheet Row Office Visit from 02/07/2024 in [facility] Office Visit from 01/08/2024 in [facility]  PHQ-2 Total Score 6 0  PHQ-9 Total Score 14 --   Flowsheet Row ED from 02/15/2024 in [facility] Office Visit from 02/07/2024 in [facility] Office Visit from 01/08/2024 in [facility]  C-SSRS RISK CATEGORY No Risk No Risk Error: Q3, 4, or 5 should not be populated when Q2 is No     Assessment and Plan:  Caleb Reid is a 18 y.o. male with a history of undifferentiated schizophrenia, depression, cannabis use, who presents for follow up appointment for below.   1. Paranoid schizophrenia (HCC) History: started to experience paranoia at age 52. Admitted to [facility] [place] 7-11/2023 with previous admissions for schizophrenia He continues to pace during  the entire visit, although he appears to be calmer on today's evaluation.  He had adverse reaction worsening in akathisia, inner restlessness when he tried lumateperone .  His symptoms have been improving since being back on olanzapine  in the last 2 days, although he continues to experience some AH which he does not recall the details.  He is willing to try Cobenfy .  Discussed potential risk which includes but not limited to constipation, nausea, dizziness.  He was advised to contact the office if any concerning adverse reaction or worsening in his symptoms.    2. Akathisia - propranolol , mirtazapine , gabapentin /pregabalin , benztropine , mirtazapine , clonazepam  resulted in limited benefit/adverse reaction He had tried several psychotropics for akathisia, which resulted in limited benefit or adverse reaction.  Although olanzapine  has been effective for schizophrenia, will switch to Cobenfy  to reduce the risk of akathisia.    3. Marijuana use, continuous - uds +  marijuana 10/2023 He has been abstinent from marijuana use in the last few months.  Psychoeducation is provided regarding its risk of worsening in psychosis.    4. High risk medication use     Last checked  EKG HR 84, QTc426msec 02/2024  Lipid panels LDL105 H 02/2024  HbA1c 4.8 02/2024    # [employer] genomic testing  Revviewed Pharmacogenetics testing with the patient, and provided the copy of the result.  It is noted that the medication he has tried, including olanzapine , Depakote  was indicated to use as directed.  There is no instruction about Cobenfy . Clozapine is listed as moderate gene drug interaction.  Given the patient's clinical course of experiencing adverse reactions to medications without known drug interactions, the plan is to proceed with judicious use of pharmacotherapy while closely monitoring for any adverse effects.    # ACT referral  We have reach out to male providers in [place] office is on the patient transfer request.   After the discussion with them, they all recommended to refer to ACT team.  This writer would agree with the plan given his severity of his symptoms, which requires frequent monitoring.  He agrees with this plan, and referral was made.      Plan Discontinue olanzapine  (was on 7.5 mg at night) Start Cobenfy  1 tab twice a day  Referred to ACT team Boby in [place]) Next appointment- 12/30 at 11 am, IP and 1/8 at 10:30, IP   Past trials- Abilify  (akathisia),  risperidone (worsening in symptoms), olanzapine , Vraylar  (akathisia), clozapine, Depakote  (worsening in irritability), Trazodone  (tingling sensation), benztropine  (more pacing), propranolol  (pacing), Pregabalin , hydroxyzine    The patient demonstrates the following risk factors for suicide: Chronic risk factors for suicide include: psychiatric disorder of schizophrenia and substance use disorder. Acute risk factors for suicide include: unemployment and recent discharge from inpatient psychiatry. Protective factors for this patient include: positive social support and hope for the future. Considering these factors, the overall suicide risk at this point appears to be low. Patient is appropriate for outpatient follow up. He denies gun access at home. Emergency resources which includes 911, ED, suicide crisis line (988) are discussed.     Collaboration of Care: Collaboration of Care: {BH OP Collaboration of Care:21014065}  Patient/Guardian was advised Release of Information must be obtained prior to any record release in order to collaborate their care with an outside provider. Patient/Guardian was advised if they have not already done so to contact the registration department to sign all necessary forms in order for us  to release information regarding their care.   Consent: Patient/Guardian gives verbal consent for treatment and assignment of benefits for services provided during this visit. Patient/Guardian expressed understanding and  agreed to proceed.    Katheren Sleet, MD 03/15/2024, 11:15 PM

## 2024-03-17 ENCOUNTER — Other Ambulatory Visit: Payer: Self-pay

## 2024-03-17 ENCOUNTER — Telehealth: Payer: Self-pay | Admitting: Psychiatry

## 2024-03-17 ENCOUNTER — Ambulatory Visit (INDEPENDENT_AMBULATORY_CARE_PROVIDER_SITE_OTHER): Payer: MEDICAID | Admitting: Psychiatry

## 2024-03-17 ENCOUNTER — Encounter: Payer: Self-pay | Admitting: Psychiatry

## 2024-03-17 VITALS — BP 126/87 | HR 80 | Temp 98.6°F | Ht 67.0 in | Wt 152.2 lb

## 2024-03-17 DIAGNOSIS — G2571 Drug induced akathisia: Secondary | ICD-10-CM | POA: Diagnosis not present

## 2024-03-17 DIAGNOSIS — F1290 Cannabis use, unspecified, uncomplicated: Secondary | ICD-10-CM

## 2024-03-17 DIAGNOSIS — Z79899 Other long term (current) drug therapy: Secondary | ICD-10-CM | POA: Diagnosis not present

## 2024-03-17 DIAGNOSIS — F200 Paranoid schizophrenia: Secondary | ICD-10-CM

## 2024-03-17 NOTE — Progress Notes (Addendum)
 BH MD/PA/NP OP Progress Note  03/17/2024 4:31 PM Caleb Reid  MRN:  [id number]  Chief Complaint:  Chief Complaint  Patient presents with   Follow-up   HPI:  This is a follow-up appointment for schizophrenia and akathisia.  He states that he is not doing good.  He cannot take Cobenfy  as it causes rash on his face, back.  He tried this medication for 2 days.  He did not start olanzapine  as he felt it close aggression and restlessness.  He continues to pace. I will start begging you to do something about this akathisia.  He states that he cannot take it anymore.  He has not been able to do video games anymore.  When he was informed that he was able to play it when he was on olanzapine  he states that I thought so,  but he was incorrect on that.  He has been off any medication in the last few weeks.  He starts to feel hopelessness due to akathisia.  He denies SI, HI, hallucinations.  He continues to stay at his paternal grandparents, who brought him here today. He states that the relationship has been perfectly well. He states that his mother is in jail, although he does not know why. He asks if procyclidine can be tried, which he heard on the internet.  I discussed with him about limitation of the evidence of trying this medication for his condition.  He would like to try lorazepam  again.  He denies alcohol use or drug use.  He agrees with the plans as outlined below.   Wt Readings from Last 3 Encounters:  03/17/24 152 lb 3.2 oz (69 kg) (46%, Z= -0.11)*  03/04/24 150 lb 9.6 oz (68.3 kg) (43%, Z= -0.18)*  02/19/24 150 lb 6.4 oz (68.2 kg) (43%, Z= -0.18)*   * Growth percentiles are based on CDC (Boys, 2-20 Years) data.     Visit Diagnosis:    ICD-10-CM   1. Paranoid schizophrenia (HCC)  F20.0     2. 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 Marijuana use, continuous  F12.90     4. High risk medication use  Z79.899 Monitor Drug Profile 10(MW)      Past Psychiatric History: Please see initial  evaluation for full details. I have reviewed the history. No updates at this time.     Past Medical History:  Past Medical History:  Diagnosis Date   Schizophrenia [facility])     Past Surgical History:  Procedure Laterality Date   HERNIA REPAIR      Family Psychiatric History: Please see initial evaluation for full details. I have reviewed the history. No updates at this time.     Family History:  Family History  Problem Relation Age of Onset   Depression Mother    Schizophrenia Maternal Grandmother     Social History:  Social History   Socioeconomic History   Marital status: Single    Spouse name: Not on file   Number of children: Not on file   Years of education: Not on file   Highest education level: High school graduate  Occupational History   Not on file  Tobacco Use   Smoking status: Never    Passive exposure: Yes   Smokeless tobacco: Never  Vaping Use   Vaping status: Never Used  Substance and Sexual Activity   Alcohol use: No   Drug use: Not Currently    Types: Marijuana   Sexual activity: Never  Other Topics Concern  Not on file  Social History Narrative   Not on file   Social Drivers of Health   Financial Resource Strain: Not on file  Food Insecurity: No Food Insecurity (02/16/2024)   Hunger Vital Sign    Worried About Running Out of Food in the Last Year: Never true    Ran Out of Food in the Last Year: Never true  Transportation Needs: No Transportation Needs (02/16/2024)   PRAPARE - Administrator, Civil Service (Medical): No    Lack of Transportation (Non-Medical): No  Physical Activity: Not on file  Stress: Not on file  Social Connections: Not on file    Allergies:  Allergies  Allergen Reactions   Propranolol  Hcl Rash    Metabolic Disorder Labs: Lab Results  Component Value Date   HGBA1C 4.8 02/16/2024   MPG 91.06 02/16/2024   MPG 91.06 11/06/2023   Lab Results  Component Value Date   PROLACTIN 5.4 01/16/2021   Lab  Results  Component Value Date   CHOL 167 02/16/2024   TRIG 97 02/16/2024   HDL 43 02/16/2024   CHOLHDL 3.9 02/16/2024   VLDL 19 02/16/2024   LDLCALC 105 (H) 02/16/2024   LDLCALC 67 11/06/2023   Lab Results  Component Value Date   TSH 6.561 (H) 02/16/2024   TSH 3.866 11/02/2023    Therapeutic Level Labs: No results found for: LITHIUM Lab Results  Component Value Date   VALPROATE <10 (L) 02/16/2024   VALPROATE 64 11/14/2023   No results found for: CBMZ  Current Medications: Current Outpatient Medications  Medication Sig Dispense Refill   nicotine  (NICODERM CQ  - DOSED IN MG/24 HOURS) 14 mg/24hr patch Place 1 patch (14 mg total) onto the skin daily. (Patient not taking: No sig reported) 28 patch 0   OLANZapine  (ZYPREXA ) 5 MG tablet Take 1.5 tablets (7.5 mg total) by mouth at bedtime. 45 tablet 0   OLANZapine  (ZYPREXA ) 7.5 MG tablet Take 1 tablet (7.5 mg total) by mouth at bedtime. 30 tablet 0   Vitamin D , Ergocalciferol , (DRISDOL ) 1.25 MG (50000 UNIT) CAPS capsule Take 1 capsule (50,000 Units total) by mouth every 7 (seven) days. (Patient not taking: No sig reported) 5 capsule 0   No current facility-administered medications for this visit.     Musculoskeletal: Strength & Muscle Tone: within normal limits Gait & Station: normal Patient leans: N/A  Psychiatric Specialty Exam: Review of Systems  Psychiatric/Behavioral:  Positive for decreased concentration and sleep disturbance. Negative for agitation, behavioral problems, confusion, dysphoric mood, hallucinations, self-injury and suicidal ideas. The patient is nervous/anxious. The patient is not hyperactive.   All other systems reviewed and are negative.   Blood pressure 126/87, pulse 80, temperature 98.6 F (37 C), temperature source Temporal, height 5' 7 (1.702 m), weight 152 lb 3.2 oz (69 kg).Body mass index is 23.84 kg/m.  General Appearance: Well Groomed  Eye Contact:  Good  Speech:  Clear and Coherent   Volume:  Normal  Mood:  not good (due to akathisia)  Affect:  Blunt  Thought Process:  Coherent  Orientation:  Full (Time, Place, and Person)  Thought Content: Logical   Suicidal Thoughts:  No  Homicidal Thoughts:  No  Memory:  Immediate;   Good  Judgement:  Good  Insight:  Good  Psychomotor Activity:  Increased  Concentration:  Concentration: Fair and Attention Span: Fair  Recall:  Good  Fund of Knowledge: Good  Language: Good  Akathisia:  Yes  Handed:  Right  AIMS (  if indicated): not done  Assets:  Communication Skills  ADL's:  Intact  Cognition: WNL  Sleep:  Poor   Screenings: AUDIT    Flowsheet Row [facility] Ad[facility] (Discharged) from 10/19/2022 in [facility]  Alcohol Use Disorder Identification Test Final Score (AUDIT) 0 0   GAD-7    Flowsheet Row Office Visit from 02/07/2024 in [facility]  Total GAD-7 Score 12   PHQ2-9    Flowsheet Row Office Visit from 02/07/2024 in [facility] Office Visit from 01/08/2024 in [facility]  PHQ-2 Total Score 6 0  PHQ-9 Total Score 14 --   Flowsheet Row ED from 02/15/2024 in [facility] Office Visit from 02/07/2024 in [facility] Office Visit from 01/08/2024 in [facility]  C-SSRS RISK CATEGORY No Risk No Risk Error: Q3, 4, or 5 should not be populated when Q2 is No     Assessment and Plan:  Caleb Reid is a 18 y.o. year old male with a history of undifferentiated schizophrenia, depression, cannabis use, who presents for follow up appointment for below.   1. Paranoid schizophrenia (HCC) History: started to experience paranoia at age 9. Admitted to [facility] [place] 7-11/2023 with previous admissions for  schizophrenia He had adverse reaction of rash from Cobenfy .  He did not restart olanzapine  due to concern of adverse reaction.  He denies any psychotic symptoms, and demonstrates respectful demeanor without behavioral issues except akathisia during the visit.  Although antipsychotics is indicated for his condition, there has been concern of ongoing akathisia even despite being off antipsychotics in the last few weeks without improvement.  He agrees to hold off any antipsychotics at this time.  Will plan to start clozapine once there is improvement in akathisia.  He agrees to contact the clinic if he were to start experiencing psychotic symptoms.  Will plan to have sooner follow-up.  He was referred to the ACT team due to him requiring higher level of care; the staff will inquire about this referral process.   2. Akathisia - propranolol , mirtazapine , gabapentin /pregabalin , benztropine , amantadine , lorazepam , clonazepam  resulted in perceived limited benefit/adverse reaction The exam is notable for pacing during the entire visit.  He tried several medication for this condition, which resulted in limited benefit and/or adverse reaction.  After discussing treatment options, we have decided to hold antipsychotics at this time to see if his symptoms improve.  He would like retrial of lorazepam , although he reportedly had adverse reaction of some stimulant effect.  Will start from lowest dose after reviewing UDS  3. Marijuana use, continuous - uds +marijuana 10/2023 He has been abstinent from marijuana use. Psychoeducation is provided regarding its risk of worsening in psychosis.   4. High risk medication use He agrees to obtain UDS before proceeding with lorazepam .       Last checked  EKG HR 84, QTc447msec 02/2024  Lipid panels LDL105 H 02/2024  HbA1c 4.8 02/2024    # [facility] genomic testing  Reviewed Pharmacogenetics testing with the patient, and provided the copy of the result.  It is noted that the  medication he has tried, including olanzapine , Depakote  was indicated to use as directed.  There is no instruction about Cobenfy . Clozapine is listed as moderate gene drug interaction.  Given the patients clinical course of experiencing adverse reactions to medications  without known drug interactions, the plan is to proceed with judicious use of pharmacotherapy while closely monitoring for any adverse effects.    # ACT referral  Although the patient initially requested to be seen by male providers in [place], their recommendation was to be referred to ACT team, which this writer also recommends.  The patient expressed understanding.  The referral was made.      Plan Start lorazepam  0.5 mg twice a day as needed for anxiety  Obtain urine screening at [facility] Referred to ACT team Boby in [place]) Next appointment- 12/30 at 11 am, IP and 1/8 at 10:30, IP  Addendum:  Due to the concern of him having adverse reaction to medication, he agreed to try vitamin B 6 from the lower dose with uptitration. He expressed understanding. Dicussed possible risk of neuropathies (12/11).  Week 1: vitamin B 6 100 mg twice a day  Week 2- : vitamin B 6 200 mg twice a day   Past trials- Abilify  (akathisia),  risperidone (worsening in symptoms), olanzapine , Vraylar  (akathisia), clozapine, Cobenfy  (rash), Depakote  (worsening in irritability), Trazodone  (tingling sensation), hydroxyzine  - propranolol  (pacing), mirtazapine , gabapentin /pregabalin , benztropine  (more pacing), amantadine , lorazepam , clonazepam  resulted in perceived limited benefit/adverse reaction   The patient demonstrates the following risk factors for suicide: Chronic risk factors for suicide include: psychiatric disorder of schizophrenia and substance use disorder. Acute risk factors for suicide include: unemployment and recent discharge from inpatient psychiatry. Protective factors for this patient include: positive social support and hope for  the future. Considering these factors, the overall suicide risk at this point appears to be low. Patient is appropriate for outpatient follow up. He denies gun access at home. Emergency resources which includes 911, ED, suicide crisis line (988) are discussed.   Collaboration of Care: Collaboration of Care: Other reviewed notes in Epic  Patient/Guardian was advised Release of Information must be obtained prior to any record release in order to collaborate their care with an outside provider. Patient/Guardian was advised if they have not already done so to contact the registration department to sign all necessary forms in order for us  to release information regarding their care.   Consent: Patient/Guardian gives verbal consent for treatment and assignment of benefits for services provided during this visit. Patient/Guardian expressed understanding and agreed to proceed.    Katheren Sleet, MD 03/17/2024, 4:31 PM

## 2024-03-17 NOTE — Telephone Encounter (Signed)
 Could you contact [facility], and inquire where he is in the process of ACT team referral? Thanks.

## 2024-03-17 NOTE — Patient Instructions (Addendum)
 Start lorazepam  0.5 mg twice a day as needed for anxiety  Hold olanzapine  Obtain urine screening at [facility] Referred to ACT team  Next appointment- 12/30 at 11 am

## 2024-03-20 ENCOUNTER — Other Ambulatory Visit: Payer: Self-pay | Admitting: Psychiatry

## 2024-03-20 ENCOUNTER — Ambulatory Visit: Payer: MEDICAID | Admitting: Psychiatry

## 2024-03-20 ENCOUNTER — Telehealth: Payer: Self-pay

## 2024-03-20 MED ORDER — VITAMIN B-6 100 MG PO TABS: 300.0000 mg | ORAL_TABLET | Freq: Two times a day (BID) | ORAL | 0 refills | Status: AC

## 2024-03-20 NOTE — Telephone Encounter (Signed)
 Pt called to clarify previous message let him know vit B 6, 300 mg twice a day for akathisia per previous message from Dr Vickey pt verbalized understanding.

## 2024-03-20 NOTE — Telephone Encounter (Addendum)
 I called the patient.   Pending UDS He continues to experience restlessness, and no change.  He states that he is doing great that he has not experienced any hallucinations. He agrees with the following.   Will start vitamin B 6, 300 mg twice a day for akathisia. Discussed possible risk of neuropathies.   Addendum:  Due to the concern of him having adverse reaction to medication, he agreed to try from the lower dose with uptitration. He expressed understanding.  Week 1: vitamin B 6 100 mg twice a day  Week 2- : vitamin B 6 200 mg twice a day

## 2024-03-21 ENCOUNTER — Ambulatory Visit: Payer: Self-pay | Admitting: Psychiatry

## 2024-03-21 LAB — MONITOR DRUG PROFILE 10(MW)
Amphetamine Scrn, Ur: NEGATIVE ng/mL
BARBITURATE SCREEN URINE: NEGATIVE ng/mL
BENZODIAZEPINE SCREEN, URINE: NEGATIVE ng/mL
Cocaine (Metab) Scrn, Ur: NEGATIVE ng/mL
Creatinine(Crt), U: 277.9 mg/dL (ref 20.0–300.0)
Methadone Screen, Urine: NEGATIVE ng/mL
OXYCODONE+OXYMORPHONE UR QL SCN: NEGATIVE ng/mL
Opiate Scrn, Ur: NEGATIVE ng/mL
Ph of Urine: 5.6 (ref 4.5–8.9)
Phencyclidine Qn, Ur: NEGATIVE ng/mL
Propoxyphene Scrn, Ur: NEGATIVE ng/mL

## 2024-03-21 LAB — CANNABINOID (GC/MS), URINE
Cannabinoid: POSITIVE — AB
Carboxy THC (GC/MS): 85 ng/mL

## 2024-03-21 NOTE — Progress Notes (Signed)
 UDS reviewed. Cannabinoid positive as expected. I have already called the patient, and discussed to start vitamin B6 before proceeding with benzodiazepine.

## 2024-03-24 ENCOUNTER — Telehealth: Payer: Self-pay

## 2024-03-24 NOTE — Telephone Encounter (Signed)
 I have tried reaching out to [facility] I have left a message on the voicemail of Almarie Code at extension [phone] no return call

## 2024-03-24 NOTE — Telephone Encounter (Signed)
 Caleb Reid with [facility] returned she stated that the closet place for mental health with [facility] would be either [place], [place], or [place]    Called to inform patient of the message spoke to patients grandparents and they stated that this would too far for them to dirve please advise

## 2024-03-24 NOTE — Telephone Encounter (Signed)
 Given his tendency to experience side effects from medications, please advise him to continue with Vitamin B6 only at this time. Remind him that this is the medication being used to address akathisia/restlessness. While it may not provide rapid symptom relief, the hope is that we will observe some improvement by his next visit in a few weeks.

## 2024-03-24 NOTE — Telephone Encounter (Signed)
 Patient stated that he taking the B6 and it does not feel it is helping advised patient to give the medication more time he voiced understanding

## 2024-03-24 NOTE — Telephone Encounter (Signed)
 Patient called stating that he is currently taking the pyridOXINE (VITAMIN B6) 100 MG tablet and would like to know when he would be able to start the Benzodiazepines please advise

## 2024-03-25 NOTE — Progress Notes (Signed)
 Caleb Reid Established Patient Clinic Note  Assessment/Plan: Caleb Reid is a 18 y.o.male who presents for follow-up.  - Current meds: Current Medications[1] Assessment & Plan Akathisia Tardive dyskinesia Psychotic episode    (CMS-HCC) Pt endorses restlessness, pacing, and overall severe compulsion to move his body over the past several weeks. They deny any AVH or mood disturbances at this time. Do not use any non-prescribed substances. They have been taking vitamin B6 as recommended by a previous psychiatrist in [place], which has helped alleviate symptoms to an small extent but has not completely resolved symptoms. Pt pacing around room during exam but remainder of neuro exam including peripheral reflexes. History and exam suggest most likely antipsychotic-induced, though differential also includes thyroid  abnormalities, vitamin D  deficiency, or low iron. Requested urinalysis as well. After discussion of evaluation and management options, pt would like to proceed with aforementioned labwork, referral to psychiatrist, and continuing vitamin B6 in the meantime. Return precautions provided, pt verbalizes understanding and has no further questions or concerns at this time.   - Referral placed to [facility] psychiatry, follow up - Follow up labs as below - Can continue to take vitamin B6 in meantime Orders:   Vitamin B12 Level; Future   CBC; Future   Ferritin; Future   Thyroid  Function [place]; Future   Vitamin D  25 Hydroxy (25OH D2 + D3)   POCT urinalysis dipstick   Ambulatory referral to Psychiatry; Future  Healthcare maintenance Pt wishes to get annual exam, unknown family history regarding diabetes, HTN, hyperlipidemia, and CAD. Given this, will plan to do routine labwork for these chronic conditions. Pt in agreement with labwork as below. Physical exam within normal limits.  - Follow up for annual physical in 1 year - Follow up labs as below Orders:    Basic Metabolic Panel   Hepatic function panel; Future   HEALTH MAINTENANCE ITEMS STILL DUE: Health Maintenance Due  Topic Date Due   HPV Vaccines (1 - Male 3-dose series) Never done   Meningococcal B Vaccines (1 of 2 - Standard) Never done   Hepatitis C Screen  Never done   DTaP/Tdap/Td Vaccines (4 - Tdap) 06/21/2023   COVID-19 Vaccine (1 - 2025-26 season) Never done   Influenza Vaccine (1) Never done   Follow-up: No follow-ups on file.  No future appointments.   Subjective Caleb Reid is a 18 y.o. male  coming to clinic today for follow-up.  Pt here with paternal great-grandparents, to establish care but also to address ongoing akathisia for past 4-5 months.   About 5 months ago, pt was treated in an inpatient psychiatric hospital in [facility]) for AV hallucination and mood changes. He stayed there for 2 weeks and was started on Abilify , which helped his symptoms but resulted in side effect of akithisia. Was switched to Zyprexa  but this had same side effect. Was then compeltely taken off these medications, but had persistent symptoms.   Moved into great-grandparents house (father's side) about 1 month ago due to disagreement with uncle with whom he was previously living. Since being there, pt's mood has been good overall and he enjoys helping out around the house.  He started seeing a psychiatrist at [facility] to help manage the [place], but they were only able to give him vitamin B6 and said if that did not work, they would not be able to help further. Pt would like to get treatment and resolution of these symptoms so he can start looking for employment and get rest.  Chief Complaint  Patient presents with   Schizophrenia /restless   Establish Care   HPI: see above for more information.   I have reviewed the problem list, medications, and allergies and have updated/reconciled them if needed.  Caleb Reid  reports that he has never smoked. He has never used smokeless  tobacco. Health Maintenance  Topic Date Due   HPV Vaccines (1 - Male 3-dose series) Never done   Meningococcal B Vaccines (1 of 2 - Standard) Never done   Hepatitis C Screen  Never done   DTaP/Tdap/Td Vaccines (4 - Tdap) 06/21/2023   COVID-19 Vaccine (1 - 2025-26 season) Never done   Influenza Vaccine (1) Never done   Pneumococcal Vaccine 0-49  Aged Out   Objective  VITALS: BP 125/67 (BP Site: L Arm, BP Position: Standing, BP Cuff Size: Medium)   Pulse 69   Temp 36.7 C (98 F) (Temporal)   Resp 16   Ht 172 cm (5' 7.72)   Wt 69.6 kg (153 lb 6.4 oz)   BMI 23.52 kg/m   Physical Exam General: well-appearing, sitting upright in no acute distress Head: Normocephalic, atraumatic ENT: No dental trauma noted.  Eyes: conjunctiva normal, non-erythematous, non-icteric, no discharge. Neck: no thyroid  enlargement or masses Lungs: CTAB, no increased work of breathing or audible wheezing CV: RRR, no MRG Skin: Warm, dry, no erythema or rash on exposed skin Musculoskeletal: No visible gait abnormalities Neurologic: Alert & oriented x 3, no gross sensorimotor abnormalities, reflexes 2+ bilaterally  Psychiatric: Pacing around room throughout conversation, otherwise cooperative, good eye contact, appropriate thought processes, does not appear to be responding to internal stimuli.   Wt Readings from Last 3 Encounters:  03/25/24 69.6 kg (153 lb 6.4 oz) (47%, Z= -0.07)*  12/21/21 66.7 kg (147 lb) (52%, Z= 0.06)*   * Growth percentiles are based on CDC (Boys, 2-20 Years) data.      PHQ-2 Score:  0  LABS/IMAGING: I have reviewed pertinent recent labs and imaging in Epic.  Note initiated by Caleb Reid, MS3  I attest that I have reviewed the student note, and that the components of the history of the present illness, the physical exam, and the assessment and plan documented were performed by me or were performed in my presence by the student where I verified the documentation and  performed (or re-performed) the exam and medical decision making.   Caleb Aho, MD, MPH (he/him) [facility] at [facility] [address]  [place], [place] [address] Phone: [phone] Fax: [phone]        [1]  Current Outpatient Medications:    pyridoxine, vitamin B6, (VITAMIN B-6) 100 MG tablet, Take 1 tablet (100 mg total) by mouth two (2) times a day., Disp: , Rfl:

## 2024-03-27 ENCOUNTER — Telehealth: Payer: Self-pay

## 2024-03-27 NOTE — Telephone Encounter (Signed)
 Noted. He agreed with this referral at his last visit. Will plan to discuss this again at his next visit.

## 2024-03-27 NOTE — Telephone Encounter (Signed)
 Referral sent to [facility] for the ACT Team confirmation received  Called patient to inform of the referral he stated that he does not think that its a good idea and he does not want to do it I explained to patient that it would be in [place] he still did not want to do it.

## 2024-04-01 NOTE — Progress Notes (Signed)
 [facility] Psychiatry Consultation Clinic Comprehensive Initial In-Person Patient Clinical Assessment - Outpatient Consultation  Identifying Information: Date: April 01, 2024 DOB: September 21, 2005 PCP: Debrah Morene Cadet, MD  Assessment Assessment / Case Formulation:   [facility] is being seen in consultation at the request of Debrah Morene Cadet, MD of [facility] Medicine for evaluation of drug induced akathisia.    The patient's subjective feeling of restlessness and concurrent constant pacing and movement is best explained by drug induced akathisia most likely 2/2 the aripiprazole  LAI doses in August and September of 2025. It may take 6-8 months for the drug to be completely eliminated meaning it may be March, April or May when elimination is complete. He has failed many medications for this condition thought some were started alongside antipsychotics which may have worsened the condition hence the reasoning for retrialing them.   Patient declined propranolol  (first line option), so mirtazapine  (2nd line) and clonazepam  (3rd line) are recommended along with a B6 dose increase as he has had some benefit with this. Seroquel /quetiapine  was discussed with the patient as a last line given its anticholinergic and anxiolytic properties though we did discuss that it carries a risk of causing akathisia.   He has a historical diagnosis of schizophrenia for which he is not currently taking antipsychotic medication orally. He denies any positive symptoms and has only blunted/flat affect as a negative one. This diagnosis is difficult to comment on as his current caregivers were not present for his first break and he has poor memory of that hospitalization and preceding events.   DSM Diagnoses:  H/o schizophrenia Medication induced akathisia   Risk Assessment: A suicide and violence risk assessment was performed as part of this evaluation. The patient is deemed to be at chronic elevated  risk for self-harm/suicide given the following factors: male age 57-35, past substance abuse, past diagnosis of schizophrenia, and severe akathisia. The patient is deemed to be at chronic elevated risk for violence given the following factors: male gender, younger age, limited work history, and past substance abuse. These risk factors are mitigated by the following factors:lack of active SI/HI, no know access to weapons or firearms, motivation for treatment, supportive family, expresses purpose for living, current treatment compliance, safe housing, and support system in agreement with treatment recommendations. There is no acute risk for suicide or violence at this time. The patient was educated about relevant modifiable risk factors including following recommendations for treatment of psychiatric illness and abstaining from substance abuse.  While future psychiatric events cannot be accurately predicted, the patient does not currently require  acute inpatient psychiatric care and does not currently meet [place]  involuntary commitment criteria.     Plan  Plan (including recommendations for additional assessments, services, or support):  The patient was seen for consultation and below are recommendations for the referring clinician (PCP) to consider and implement based on their clinical judgment.  We do not provide medication management and do not send in prescriptions as part of this consultation clinic.  The patient has been instructed to NOT make changes to their medications until they discuss with the referring clinician (PCP).   Medication recommendations:  - increase vitamin B6 to 300 mg BID - first, start mirtazapine  15 mg nightly  - increase to 30 mg after one week if tolerated and not fully effective  - can be increased to 45 mg nightly if above fails - second (if mirtazapine  fails), start clonazepam  0.5 mg BID  - increase to  1 mg BID after one week if tolerated and not fully  effective - third (if clonazepam  fails), start seroquel  12.5 mg BID  - Increase to 25 mg BID after one week if tolerated and not fully effective  - can be increased to 50 mg BID if above fails  Psychotherapy recommendations: none  Psychosocial interventions: none  Additional workup:  none  Follow-up:  will refer to [facility].  They have been informed to continue to follow up with their PCP for ongoing medication management and any additional questions/concerns after this appointment.  They have been provided with suicide crisis resources in their AVS and instructed to call 911 for emergences.   As a reminder, follow up questions may also be addressed using E-consults (Place referral for Ambulatory E-Consult to Psychiatry order).  Thank you for the opportunity to participate in the care of Caleb Reid. Please contact me with further questions or concerns.  Discussed with the attending MD, Spurgeon Later, MD, who agrees with the assessment and plan.  Zachary Daring, MD     Subjective  Subjective:  Psychiatric Chief Concern: restlessness  HPI: Patient is a 18 y.o., Unknown White race, Unknown ethnicity,  ENGLISH speaking male  with a history of schizophrenia.   His restlessness started 5 months ago after being hospitalized what he and family states was an anger episode related to a poor home situation (mother has a substance use disorder and was threatening to kick him out). He received a diagnosis of schizophrenia and was started on abilify  LAI. He has been restless and pacing till [his] heels hurt since. He did not stop pacing during the visit. The restlessness has improved somewhat with B6 300 mg TDD. He tried many other medications including mirtazapine , propranolol , benztropine , amantadine , clonazepam  for it and none worked. He did not try max doses of any of them. He insists that propranolol  made things worse. He was also trialed on other APDs around the time  of these meds including olanzapine , cariprazine , lumateperone  and cobenfy . He is not currently taking an oral APD. He denies symptoms of psychosis as do his grandparents currently living with him.   Mood Symptoms:  denies Mania screen: denies Safety: Suicidality: denies, Self-Harm: denies, Homicidality: denies  Anxiety Symptoms: denies Trauma related symptoms: see above about previous living situation  Psychotic Symptoms: denies. Grandparents mention he may have had AH in the past, but they deny seeing him psychotic at any point  Substance Use/Abuse:  Alcohol: denies Illicit: previously used THC Licit: denies Tobacco - denies Caffeine - denies  Psychiatric History: Diagnoses: schizophrenia Medication Trials: see above Hospitalizations: one in July-August 2025 for anger issues vs psychosis. Spent one day at [facility] this fall because of distress from akathisia.  Suicide attempts: denies Current Psychiatrist: HISADA,REINA MD Current Therapist: denies Prior behavioral health providers: denies  Medical/Surgical History: Past Medical History[1]  Past Surgical History[2]  Social History: Living Situation: lives with grandparents Guardian/Payee: None Children: None Education: High school diploma/GED Income/Employment/Disability: Currently Unemployed, vocational support not requested Abuse/Neglect/Trauma: see HPI. Childhood home was unstable. Current/Prior Legal: None Access to Firearms: None   Family History: Family Psych History: mother has substance use disorder The patient's family history is not on file..      Objective   Objective:   Vitals:  Vitals:   04/01/24 0933  BP: 115/59  Pulse: 79    Mental Status Exam: Appearance:    Appears stated age  Motor:   Psychomotor agitation and  pacing the room the entire visit  Speech/Language:    Normal rate, volume, tone, fluency  Mood:   Tired of this  Affect:   Blunted and Dysthymic  Thought process:    Logical, linear, clear, coherent, goal directed  Thought content:     Denies SI, HI, self harm, delusions, obsessions, paranoid ideation, or ideas of reference  Perceptual disturbances:     Denies auditory and visual hallucinations, behavior not concerning for response to internal stimuli   Orientation:   Oriented to person, place, time, and general circumstances  Attention:   Able to fully attend without fluctuations in consciousness  Memory:   Poor memory of hospitalization this summer. Otherwise grossly intact.   Fund of knowledge:    Consistent with level of education and development  Insight:     Intact  Judgment:    Intact  Impulse Control:   Intact    Test Results:  Psychometrics:       PHQ-9 <redacted file path>  <redacted file path>  <redacted file path>  GAD-7 <redacted file path>  <redacted file path>  <redacted file path>  STOP Bang <redacted file path>  <redacted file path>  <redacted file path>  PCL-5 <redacted file path>  <redacted file path>  <redacted file path>      Visit was completed face to face.    I personally spent 120 minutes face-to-face and non-face-to-face in the care of this patient, which includes all pre, intra, and post visit time on the date of service.  All documented time was specific to the E/M visit and does not include any procedures that may have been performed.  Zachary Daring, MD 04/01/2024      [1] Past Medical History: Diagnosis Date   Schizophrenia (CMS-HCC)   [2] No past surgical history on file.

## 2024-04-04 ENCOUNTER — Telehealth: Payer: Self-pay | Admitting: Psychiatry

## 2024-04-08 ENCOUNTER — Ambulatory Visit: Payer: MEDICAID | Admitting: Psychiatry

## 2024-04-17 ENCOUNTER — Ambulatory Visit: Payer: MEDICAID | Admitting: Psychiatry

## 2024-04-28 NOTE — Progress Notes (Signed)
 [facility] Comprehensive Oral Evaluation and Prophylaxis  Caleb Reid is a 19 y.o. male who presents to the [facility] on 05/01/2024 for a dental exam and information gathering appointment [phone]) and prophylaxis.  Dental procedures in this visit   [id number] - COMPREHENSIVE ORAL EVAL PLAN (Completed)    Service provider: Thelbert Nest    Billing provider: Myrna Drown, DDS   [phone] - PROPHYLAXIS - ADULT (Completed)    Service provider: Thelbert Nest    Billing provider: Myrna Drown, DDS   [phone] - TOPICAL APPLICATN FLUORIDE VARNISH (Completed)    Service provider: Thelbert Nest    Billing provider: Myrna Drown, DDS    SUBJECTIVE:  Chief Complaint: I am here to have fillings that I need before getting braces.  PMH: Past Medical History[1]  Medications: Current Medications[2]  Allergies: Caplyta  [lumateperone ], Cobenfy  [xanomeline-trospium], and Propranolol  hcl  Social: Short Social History[3]  Review of Systems: Systems reviewed and were negative, except for: General: Negative Skin: Negative HEENT: Negative Respiratory: Negative Cardiac: Negative GI: Negative Renal: Negative Musculoskeletal: Negative Neurologic: Negative Hematologic: Negative Endocrine: Negative   OBJECTIVE:  Vitals: BP 147/77 (BP Site: R Arm, BP Position: Sitting)   Pulse 82   ASA Classification: ASA 1 - Normal health patient  Soft Tissue Exam: Extraoral exam: Head size, head shape, facial symmetry, skin, complexion, pigmentation, conjunctiva, oral labia, parotid gland, submandibular gland, thyroid , cervical and preauricular lymph nodes, submandibular and submental lymph nodes, tonsillar and occipital lymph nodes, and supraclavicular lymph nodes WNL. No swelling or lymphadenopathy.  TMJ: Max opening: >39mm Pre-auricular pain: None Clicking: Bilateral, on closing no pain Crepitus: None Locking: none  Muscles of Mastication tenderness: None  Deviation:  None  Intraoral exam: MMM, FOM soft and non-raised and no signs of trismus. Lips, buccal mucosa, vestibules and frenula, gingiva and ridges, hard and soft palate, oropharynx and tonsils, salivary glands, tongue, FOM, and submandibular area without mass or lesion. Oral cancer screening performed and negative.  Bilateral linea alba Mallampati Class 4 - Patient education regarding future symptomology of OSA and GERD was rendered  Occlusal Exam: Facial asymmetries: no Profile: Straight slightly convexed Lip competence: yes R Molar: class II   R Canine: class II  L Molar: class II   L Canine: class II  Overbite: 20 Overjet: 2mm Crossbites: yes on the right between 2,3 30,31 Maxillary Spacing/Crowding: Spacing mild Mandibular Spacing/Crowding: Spacing severe Upper midline coincident to face: yes Lower midline coincident to face: no Midlines coincident: no, lower midline 2 mm to the right  Hard Tissue Exam: All existing restorations and new findings noted on odontogram. Missing: None Decay/Caries:  Restorable: #2, #3, #15, #18, #19, #30, and #31 General Findings: Generalized hypomineralization  <2     <3  <15    <18  <19    <30  <31  Periodontal Exam: Full periodontal charting documented in odontogram.  Probing depths: 1-4 mm Clinical Attachment Loss: 1-4 mm Bleeding on probing: 13.3% Mobility: None Furcations: No furcation involvement Gingiva: generalized, healthy, and pink with localized areas of erythematous tissue Plaque: Medium Calculus: Heavy Bone loss: None Oral hygiene: Fair  Periodontal Diagnosis: Gingivitis, Generalized  Adult Prophylaxis: Procedure: Ultrasonic Scaling, Hand Scaling, Polish, Medium Grit, Fluoride Varnish, and Oral Health Instructions Reviewed OHI including Brush 2x daily including tongue, Fluoridated toothpaste, Modified Bass technique, Floss 1x daily, C-shaped flossing technique, Interproximal brushes, and Fluoridated and alcohol-free mouth  rinse Demonstrated and dispensed toothbrush, toothpaste, floss, and Proxabrush    ASSESSMENT:  Diagnosis: Hard Tissue: Caries / restorable Pulpal: Normal pulp Periapical: Normal periapex Periodontal: Gingivitis, Generalized   PLAN:  Discussion: Findings discussed with the patient. After discussion and exploring various treatment options, the following treatment plan was finalized:    Treatment Plan: [x]  D0150PLAN (Transfer DXT), D1110, [id number] []  Composite Restorations: 2O, 3O, 15O []  Composite Restorations: 30O, 31BO []  Composite Restorations: 18BO, 19BO  Next Visit:  1/27 1:30PM - Composite Restorations 1/28 1:30PM - Composite Restorations 2/12 1:30PM - Composite Restorations  Questions were invited and answered. The patient tolerated the evaluation well and departed in stable condition.  Provider: Delon Sniff, DDS Class of 2027 Attending: Dr. Myrna Date: 05/01/2024  Time: 11:49 AM        [1] Past Medical History: Diagnosis Date   Schizophrenia (CMS-HCC)   [2] Current Outpatient Medications  Medication Sig Dispense Refill   mirtazapine  (REMERON ) 15 MG tablet Take 1 tablet (15 mg total) by mouth nightly. 90 tablet 3   pyridoxine, vitamin B6, (VITAMIN B-6) 100 MG tablet Take 3 tablets (300 mg total) by mouth two (2) times a day. 540 tablet 1   doxycycline (VIBRA-TABS) 100 MG tablet Take 1 tablet (100 mg total) by mouth two (2) times a day. Take with food. Avoid excess sun exposure while taking (Patient not taking: Reported on 05/01/2024) 90 tablet 0   ketoconazole (NIZORAL) 2 % shampoo Apply topically Two (2) times a week. In the shower. Apply to brown patches and rinse. Continue until clear. (Patient not taking: Reported on 05/01/2024) 120 mL 5   RETIN-A 0.025 % cream Apply 2-3 times weekly at night to dry skin after cleaning. Increase frequency up to nightly as tolerated. (Patient not taking: Reported on 05/01/2024) 45 g 5   No current facility-administered  medications for this visit.  [3] Social History Tobacco Use   Smoking status: Never   Smokeless tobacco: Never  Vaping Use   Vaping status: Never Used  Substance Use Topics   Alcohol use: Never   Drug use: Never
